# Patient Record
Sex: Male | Born: 1945
Health system: Southern US, Community
[De-identification: ages and names within clinical notes are randomized; demographics above are authoritative.]

## PROBLEM LIST (undated history)

## (undated) DIAGNOSIS — G4730 Sleep apnea, unspecified: Secondary | ICD-10-CM

## (undated) DIAGNOSIS — J449 Chronic obstructive pulmonary disease, unspecified: Secondary | ICD-10-CM

## (undated) DIAGNOSIS — Z860100 Personal history of colon polyps, unspecified: Secondary | ICD-10-CM

## (undated) DIAGNOSIS — I10 Essential (primary) hypertension: Secondary | ICD-10-CM

## (undated) DIAGNOSIS — M1990 Unspecified osteoarthritis, unspecified site: Secondary | ICD-10-CM

## (undated) DIAGNOSIS — K4090 Unilateral inguinal hernia, without obstruction or gangrene, not specified as recurrent: Secondary | ICD-10-CM

## (undated) HISTORY — PX: BACK SURGERY: SHX140

## (undated) HISTORY — DX: Unilateral inguinal hernia, without obstruction or gangrene, not specified as recurrent: K40.90

## (undated) HISTORY — PX: SHOULDER SURGERY: SHX246

## (undated) HISTORY — PX: OTHER SURGICAL HISTORY: SHX169

---

## 2004-08-16 ENCOUNTER — Other Ambulatory Visit: Admission: RE | Admit: 2004-08-16 | Discharge: 2004-08-16 | Payer: Self-pay | Admitting: Family Medicine

## 2005-08-26 HISTORY — PX: OTHER SURGICAL HISTORY: SHX169

## 2005-12-31 ENCOUNTER — Ambulatory Visit (HOSPITAL_COMMUNITY): Admission: RE | Admit: 2005-12-31 | Discharge: 2005-12-31 | Payer: Self-pay | Admitting: Family Medicine

## 2006-07-09 ENCOUNTER — Encounter (INDEPENDENT_AMBULATORY_CARE_PROVIDER_SITE_OTHER): Payer: Self-pay | Admitting: *Deleted

## 2006-07-09 ENCOUNTER — Ambulatory Visit (HOSPITAL_COMMUNITY): Admission: RE | Admit: 2006-07-09 | Discharge: 2006-07-09 | Payer: Self-pay | Admitting: Internal Medicine

## 2006-07-09 ENCOUNTER — Ambulatory Visit: Payer: Self-pay | Admitting: Internal Medicine

## 2007-01-12 ENCOUNTER — Ambulatory Visit (HOSPITAL_COMMUNITY): Admission: RE | Admit: 2007-01-12 | Discharge: 2007-01-12 | Payer: Self-pay | Admitting: Family Medicine

## 2008-02-18 ENCOUNTER — Ambulatory Visit (HOSPITAL_COMMUNITY): Admission: RE | Admit: 2008-02-18 | Discharge: 2008-02-18 | Payer: Self-pay | Admitting: Unknown Physician Specialty

## 2009-03-22 ENCOUNTER — Ambulatory Visit (HOSPITAL_COMMUNITY): Admission: RE | Admit: 2009-03-22 | Discharge: 2009-03-22 | Payer: Self-pay | Admitting: Family Medicine

## 2010-04-06 ENCOUNTER — Ambulatory Visit (HOSPITAL_COMMUNITY): Admission: RE | Admit: 2010-04-06 | Discharge: 2010-04-06 | Payer: Self-pay | Admitting: Internal Medicine

## 2011-01-11 NOTE — Op Note (Signed)
Zachary Gentry, Zachary Gentry                ACCOUNT NO.:  [id number]   MEDICAL RECORD NO.:  [id number]          PATIENT TYPE:  AMB   LOCATION:  DAY                           FACILITY:  [facility]   PHYSICIAN:  Lionel December, M.D.    DATE OF BIRTH:  November 20, 1945   DATE OF PROCEDURE:  07/09/2006  DATE OF DISCHARGE:                                 OPERATIVE REPORT   PROCEDURE PERFORMED:  Colonoscopy.   INDICATIONS FOR PROCEDURE:  Quitman Livings is 65 year old Caucasian male with  history of colonic polyps whose last exam was via [place] in [place], [place] who is  for surveillance exam.  Family history is negative for  colorectal carcinoma.   MEDS FOR CONSCIOUS SEDATION:  Demerol 50 mg IV, Versed 7 mg IV.   DESCRIPTION OF PROCEDURE:  Procedure performed in endoscopy suite.  The  patient's vital signs and oxygen saturation were monitored during the  procedure and remained stable.  The patient was placed in left lateral  recumbent position and rectal examination performed.  No abnormality noted  on external or digital exam.  Olympus video scope was placed in the rectum  and advanced under vision into sigmoid colon and beyond.  Preparation was  excellent.  Scope was passed to cecum which was identified by appendiceal  orifice and ileocecal valve.  There was small polyp just above ileocecal  valve which was ablated by cold biopsy.  Mucosa rest of the colon was  normal.  Rectal mucosa similarly was normal.  Scope was retroflexed to  examine the anorectal junction and small hemorrhoids were noted below the  dentate line.  Endoscope was straightened and withdrawn.  The patient  tolerated the procedure well.   FINAL DIAGNOSIS:  Small polyp ablated by cold biopsy from ascending colon.  Small internal hemorrhoids.   RECOMMENDATIONS:  1. He will resume his usual meds and diet.  2. Continue yearly Hemoccults.  3. I will be contacting patient results of biopsy. He will return for      follow-up exam in five  years, if that exam is negative then could      stretch the interval to seven years or longer.      Lionel December, M.D.  Electronically Signed    NR/MEDQ  D:  07/09/2006  T:  07/09/2006  Job:  [id number]   cc:   Patrica Duel, M.D.  Fax: [phone]

## 2011-04-18 ENCOUNTER — Other Ambulatory Visit (HOSPITAL_COMMUNITY): Payer: Self-pay | Admitting: Internal Medicine

## 2011-04-18 ENCOUNTER — Ambulatory Visit (HOSPITAL_COMMUNITY)
Admission: RE | Admit: 2011-04-18 | Discharge: 2011-04-18 | Disposition: A | Discharge status: Home / Self Care | Payer: BC Managed Care – PPO | Source: Ambulatory Visit | Attending: Internal Medicine | Admitting: Internal Medicine

## 2011-04-18 DIAGNOSIS — J449 Chronic obstructive pulmonary disease, unspecified: Secondary | ICD-10-CM

## 2011-04-18 DIAGNOSIS — R059 Cough, unspecified: Secondary | ICD-10-CM | POA: Insufficient documentation

## 2011-04-18 DIAGNOSIS — Z0000 Encounter for general adult medical examination without abnormal findings: Secondary | ICD-10-CM

## 2011-04-18 DIAGNOSIS — J4489 Other specified chronic obstructive pulmonary disease: Secondary | ICD-10-CM | POA: Insufficient documentation

## 2011-04-18 DIAGNOSIS — R05 Cough: Secondary | ICD-10-CM | POA: Insufficient documentation

## 2011-07-26 ENCOUNTER — Encounter (INDEPENDENT_AMBULATORY_CARE_PROVIDER_SITE_OTHER): Payer: Self-pay | Admitting: *Deleted

## 2011-08-12 ENCOUNTER — Telehealth (INDEPENDENT_AMBULATORY_CARE_PROVIDER_SITE_OTHER): Payer: Self-pay | Admitting: *Deleted

## 2011-08-12 NOTE — Telephone Encounter (Signed)
LM stating it was time for tcs. The return phone number is [phone].

## 2011-08-12 NOTE — Telephone Encounter (Signed)
Spoke to patient, made him aware TCS will not be until Feb or March and I would mail him a letter in January to get this scheduled

## 2011-08-29 ENCOUNTER — Encounter (INDEPENDENT_AMBULATORY_CARE_PROVIDER_SITE_OTHER): Payer: Self-pay | Admitting: *Deleted

## 2011-08-29 ENCOUNTER — Other Ambulatory Visit (INDEPENDENT_AMBULATORY_CARE_PROVIDER_SITE_OTHER): Payer: Self-pay | Admitting: *Deleted

## 2011-08-29 DIAGNOSIS — Z86010 Personal history of colonic polyps: Secondary | ICD-10-CM

## 2011-09-18 ENCOUNTER — Telehealth (INDEPENDENT_AMBULATORY_CARE_PROVIDER_SITE_OTHER): Payer: Self-pay | Admitting: *Deleted

## 2011-09-18 NOTE — Telephone Encounter (Signed)
Requesting MD/PCP:  [facility]     Name & DOB:  Zachary Gentry  May 27, 2046     Procedure: TCS  Reason/Indication:  HX POLYPS  Has patient had this procedure before?  YES  If so, when, by whom and where?  2007  Is there a family history of colon cancer?  NO  Who?  What age when diagnosed?    Is patient diabetic?   NO      Does patient have prosthetic heart valve?  NO  Do you have a pacemaker?  NO  Has patient had joint replacement within last 12 months?  NO  Is patient on Coumadin, Plavix and/or Aspirin? NO  Medications: LOSARTAN 50 MG DAILY  Allergies: NKDA  Pharmacy:   Medication Adjustment: NONE  Procedure date & time: 10/02/11 @ 930

## 2011-09-20 ENCOUNTER — Encounter (HOSPITAL_COMMUNITY): Payer: Self-pay | Admitting: Pharmacy Technician

## 2011-09-20 NOTE — Telephone Encounter (Signed)
agree

## 2011-10-01 MED ORDER — SODIUM CHLORIDE 0.45 % IV SOLN
Freq: Once | INTRAVENOUS | Status: AC
  Administered 2011-10-02: 1000 mL via INTRAVENOUS

## 2011-10-02 ENCOUNTER — Ambulatory Visit (HOSPITAL_COMMUNITY)
Admission: RE | Admit: 2011-10-02 | Discharge: 2011-10-02 | Disposition: A | Discharge status: Home / Self Care | Payer: BC Managed Care – PPO | Source: Ambulatory Visit | Attending: Internal Medicine | Admitting: Internal Medicine

## 2011-10-02 ENCOUNTER — Encounter (HOSPITAL_COMMUNITY)
Admission: RE | Disposition: A | Discharge status: Home / Self Care | Payer: Self-pay | Source: Ambulatory Visit | Attending: Internal Medicine

## 2011-10-02 ENCOUNTER — Encounter (HOSPITAL_COMMUNITY): Payer: Self-pay | Admitting: *Deleted

## 2011-10-02 DIAGNOSIS — I10 Essential (primary) hypertension: Secondary | ICD-10-CM | POA: Insufficient documentation

## 2011-10-02 DIAGNOSIS — K644 Residual hemorrhoidal skin tags: Secondary | ICD-10-CM | POA: Insufficient documentation

## 2011-10-02 DIAGNOSIS — Z79899 Other long term (current) drug therapy: Secondary | ICD-10-CM | POA: Insufficient documentation

## 2011-10-02 DIAGNOSIS — Z860100 Personal history of colon polyps, unspecified: Secondary | ICD-10-CM | POA: Insufficient documentation

## 2011-10-02 DIAGNOSIS — J4489 Other specified chronic obstructive pulmonary disease: Secondary | ICD-10-CM | POA: Insufficient documentation

## 2011-10-02 DIAGNOSIS — Z09 Encounter for follow-up examination after completed treatment for conditions other than malignant neoplasm: Secondary | ICD-10-CM | POA: Insufficient documentation

## 2011-10-02 DIAGNOSIS — J449 Chronic obstructive pulmonary disease, unspecified: Secondary | ICD-10-CM | POA: Insufficient documentation

## 2011-10-02 DIAGNOSIS — Z1211 Encounter for screening for malignant neoplasm of colon: Secondary | ICD-10-CM

## 2011-10-02 HISTORY — DX: Essential (primary) hypertension: I10

## 2011-10-02 HISTORY — DX: Personal history of colonic polyps: Z86.010

## 2011-10-02 HISTORY — DX: Chronic obstructive pulmonary disease, unspecified: J44.9

## 2011-10-02 HISTORY — DX: Unspecified osteoarthritis, unspecified site: M19.90

## 2011-10-02 HISTORY — DX: Personal history of colon polyps, unspecified: Z86.0100

## 2011-10-02 HISTORY — PX: COLONOSCOPY: SHX5424

## 2011-10-02 SURGERY — COLONOSCOPY
Anesthesia: Moderate Sedation

## 2011-10-02 MED ORDER — MIDAZOLAM HCL 5 MG/5ML IJ SOLN
INTRAMUSCULAR | Status: DC | PRN
  Administered 2011-10-02 (×4): 2 mg via INTRAVENOUS

## 2011-10-02 MED ORDER — STERILE WATER FOR IRRIGATION IR SOLN
Status: DC | PRN
  Administered 2011-10-02: 09:00:00

## 2011-10-02 MED ORDER — MEPERIDINE HCL 50 MG/ML IJ SOLN
INTRAMUSCULAR | Status: AC
  Filled 2011-10-02: qty 1

## 2011-10-02 MED ORDER — MEPERIDINE HCL 50 MG/ML IJ SOLN
INTRAMUSCULAR | Status: DC | PRN
  Administered 2011-10-02 (×2): 25 mg via INTRAVENOUS

## 2011-10-02 MED ORDER — MIDAZOLAM HCL 5 MG/5ML IJ SOLN
INTRAMUSCULAR | Status: AC
  Filled 2011-10-02: qty 10

## 2011-10-02 NOTE — H&P (Signed)
Zachary Gentry is an 66 y.o. male.   Chief Complaint: Patient is in for colonoscopy. HPI: Patient is a 66 year old Caucasian with history of colonic polyps. He had 3 adenomas removed on his first exam at [facility] in [place]. His Lasix was at this facility in November 2007 and another tubular adenoma removed. He is undergoing surveillance colonoscopy. He denies change in his bowel habits, rectal bleeding or abdominal pain. Family history is negative for colorectal carcinoma.  Past Medical History  Diagnosis Date  . Hypertension   . COPD (chronic obstructive pulmonary disease)   . Hemorrhoids   . Arthritis   . History of colon polyps     Past Surgical History  Procedure Date  . Back surgery '79  . Right knee surgery 2007  . Left knee surgery '96    History reviewed. No pertinent family history. Social History:  reports that he has been passively smoking Cigarettes.  He has a 6.25 pack-year smoking history. He does not have any smokeless tobacco history on file. He reports that he drinks alcohol. He reports that he does not use illicit drugs.  Allergies: No Known Allergies  Medications Prior to Admission  Medication Dose Route Frequency Provider Last Rate Last Dose  . 0.45 % sodium chloride infusion   Intravenous Once Malissa Hippo, MD 20 mL/hr at 10/02/11 0903 1,000 mL at 10/02/11 0903  . meperidine (DEMEROL) 50 MG/ML injection           . midazolam (VERSED) 5 MG/5ML injection            Medications Prior to Admission  Medication Sig Dispense Refill  . b complex vitamins tablet Take 1 tablet by mouth daily.      . diazepam (VALIUM) 10 MG tablet Take 10 mg by mouth as needed. For anxiety      . ibuprofen (ADVIL,MOTRIN) 200 MG tablet Take 600 mg by mouth every 6 (six) hours as needed. For pain      . losartan (COZAAR) 50 MG tablet Take 50 mg by mouth daily.      . milk thistle 175 MG tablet Take 175 mg by mouth daily.        No results found for this or any previous visit  (from the past 48 hour(s)). No results found.  Review of Systems  Constitutional: Negative for weight loss.  Gastrointestinal: Negative for abdominal pain, diarrhea, constipation, blood in stool and melena.    Blood pressure 131/77, temperature 97 F (36.1 C), temperature source Oral, resp. rate 13, height 5' 10.5" (1.791 m), weight 192 lb (87.091 kg), SpO2 100.00%. Physical Exam  Constitutional: He appears well-developed and well-nourished.  HENT:  Mouth/Throat: Oropharynx is clear and moist.  Eyes: Conjunctivae are normal. No scleral icterus.  Neck: No thyromegaly present.  Cardiovascular: Normal rate, regular rhythm and normal heart sounds.   No murmur heard. Respiratory: Effort normal and breath sounds normal.  GI: Soft. He exhibits no distension and no mass. There is no tenderness.  Musculoskeletal: He exhibits no edema.  Lymphadenopathy:    He has no cervical adenopathy.  Neurological: He is alert.  Skin: Skin is warm and dry.     Assessment/Plan History of colonic polyps. Surveillance colonoscopy.  Zachary Gentry 10/02/2011, 9:10 AM

## 2011-10-02 NOTE — Op Note (Signed)
COLONOSCOPY PROCEDURE REPORT  PATIENT:  Zachary Gentry  MR#:  [id number] Birthdate:  01-08-1946, 66 y.o., male Endoscopist:  Dr. Malissa Hippo, MD Referred By:  Dr. Madelin Rear. Sherwood Gambler, MD Procedure Date: 10/02/2011  Procedure:   Colonoscopy  Indications:  Patient is 66 year old Caucasian male with history of colonic adenomas. He is undergoing surveillance colonoscopy. His Lasix and was in November 2007.  Informed Consent:  Procedure and risks were reviewed with the patient and informed consent was obtained.  Medications:  Demerol 50 mg IV Versed 8 mg IV  Description of procedure:  After a digital rectal exam was performed, that colonoscope was advanced from the anus through the rectum and colon to the area of the cecum, ileocecal valve and appendiceal orifice. The cecum was deeply intubated. These structures were well-seen and photographed for the record. From the level of the cecum and ileocecal valve, the scope was slowly and cautiously withdrawn. The mucosal surfaces were carefully surveyed utilizing scope tip to flexion to facilitate fold flattening as needed. The scope was pulled down into the rectum where a thorough exam including retroflexion was performed.  Findings:   Prep excellent. Normal mucosa throughout. Normal rectal mucosa.  Small hemorrhoids below the dentate line.  Therapeutic/Diagnostic Maneuvers Performed:  None  Complications:  None  Cecal Withdrawal Time:  11 minutes  Impression:  Examination performed to cecum. No evidence of recurrent polyps. Small external hemorrhoids.  Recommendations:  Standard instructions given. Next colonoscopy in 5 years.  Zachary Gentry  10/02/2011 9:39 AM  CC: Dr. Cassell Smiles., MD, MD & Dr. Bonnetta Barry ref. provider found

## 2011-10-08 ENCOUNTER — Encounter (HOSPITAL_COMMUNITY): Payer: Self-pay | Admitting: Internal Medicine

## 2012-02-19 ENCOUNTER — Other Ambulatory Visit (HOSPITAL_COMMUNITY): Payer: Self-pay | Admitting: Internal Medicine

## 2012-02-19 DIAGNOSIS — R1084 Generalized abdominal pain: Secondary | ICD-10-CM

## 2012-02-21 ENCOUNTER — Ambulatory Visit (HOSPITAL_COMMUNITY)
Admission: RE | Admit: 2012-02-21 | Discharge: 2012-02-21 | Disposition: A | Discharge status: Home / Self Care | Payer: BC Managed Care – PPO | Source: Ambulatory Visit | Attending: Internal Medicine | Admitting: Internal Medicine

## 2012-02-21 DIAGNOSIS — R1032 Left lower quadrant pain: Secondary | ICD-10-CM | POA: Insufficient documentation

## 2012-02-21 DIAGNOSIS — R1031 Right lower quadrant pain: Secondary | ICD-10-CM | POA: Insufficient documentation

## 2012-02-21 DIAGNOSIS — R1084 Generalized abdominal pain: Secondary | ICD-10-CM

## 2012-02-21 DIAGNOSIS — R933 Abnormal findings on diagnostic imaging of other parts of digestive tract: Secondary | ICD-10-CM | POA: Insufficient documentation

## 2012-02-21 DIAGNOSIS — K439 Ventral hernia without obstruction or gangrene: Secondary | ICD-10-CM | POA: Insufficient documentation

## 2012-02-21 DIAGNOSIS — K4090 Unilateral inguinal hernia, without obstruction or gangrene, not specified as recurrent: Secondary | ICD-10-CM | POA: Insufficient documentation

## 2012-02-21 DIAGNOSIS — N400 Enlarged prostate without lower urinary tract symptoms: Secondary | ICD-10-CM | POA: Insufficient documentation

## 2012-02-21 MED ORDER — IOHEXOL 300 MG/ML  SOLN
100.0000 mL | Freq: Once | INTRAMUSCULAR | Status: AC | PRN
  Administered 2012-02-21: 100 mL via INTRAVENOUS

## 2012-02-26 ENCOUNTER — Encounter (INDEPENDENT_AMBULATORY_CARE_PROVIDER_SITE_OTHER): Payer: Self-pay | Admitting: *Deleted

## 2012-03-24 ENCOUNTER — Encounter (INDEPENDENT_AMBULATORY_CARE_PROVIDER_SITE_OTHER): Payer: Self-pay | Admitting: Internal Medicine

## 2012-03-24 ENCOUNTER — Ambulatory Visit (INDEPENDENT_AMBULATORY_CARE_PROVIDER_SITE_OTHER): Payer: BC Managed Care – PPO | Admitting: Internal Medicine

## 2012-03-24 VITALS — BP 104/70 | HR 72 | Temp 96.7°F | Resp 20 | Ht 70.0 in | Wt 185.0 lb

## 2012-03-24 DIAGNOSIS — E291 Testicular hypofunction: Secondary | ICD-10-CM

## 2012-03-24 DIAGNOSIS — Z860100 Personal history of colon polyps, unspecified: Secondary | ICD-10-CM | POA: Insufficient documentation

## 2012-03-24 DIAGNOSIS — I10 Essential (primary) hypertension: Secondary | ICD-10-CM | POA: Insufficient documentation

## 2012-03-24 DIAGNOSIS — G8929 Other chronic pain: Secondary | ICD-10-CM | POA: Insufficient documentation

## 2012-03-24 DIAGNOSIS — K529 Noninfective gastroenteritis and colitis, unspecified: Secondary | ICD-10-CM

## 2012-03-24 DIAGNOSIS — K5289 Other specified noninfective gastroenteritis and colitis: Secondary | ICD-10-CM

## 2012-03-24 DIAGNOSIS — R7989 Other specified abnormal findings of blood chemistry: Secondary | ICD-10-CM

## 2012-03-24 LAB — CBC WITH DIFFERENTIAL/PLATELET
Basophils Absolute: 0.1 K/uL (ref 0.0–0.1)
Basophils Relative: 1 % (ref 0–1)
Eosinophils Absolute: 0.2 K/uL (ref 0.0–0.7)
Eosinophils Relative: 4 % (ref 0–5)
HCT: 44 % (ref 39.0–52.0)
Hemoglobin: 15.1 g/dL (ref 13.0–17.0)
Lymphocytes Relative: 27 % (ref 12–46)
Lymphs Abs: 1.2 K/uL (ref 0.7–4.0)
MCH: 33.1 pg (ref 26.0–34.0)
MCHC: 34.3 g/dL (ref 30.0–36.0)
MCV: 96.5 fL (ref 78.0–100.0)
Monocytes Absolute: 0.3 K/uL (ref 0.1–1.0)
Monocytes Relative: 7 % (ref 3–12)
Neutro Abs: 2.7 K/uL (ref 1.7–7.7)
Neutrophils Relative %: 61 % (ref 43–77)
Platelets: 246 K/uL (ref 150–400)
RBC: 4.56 MIL/uL (ref 4.22–5.81)
RDW: 13.7 % (ref 11.5–15.5)
WBC: 4.5 K/uL (ref 4.0–10.5)

## 2012-03-24 LAB — C-REACTIVE PROTEIN: CRP: <0.5 mg/dL

## 2012-03-24 MED ORDER — CIPROFLOXACIN HCL 500 MG PO TABS: 500.0000 mg | ORAL_TABLET | Freq: Two times a day (BID) | ORAL | Status: AC

## 2012-03-24 MED ORDER — METRONIDAZOLE 500 MG PO TABS: 500.0000 mg | ORAL_TABLET | Freq: Two times a day (BID) | ORAL | Status: AC

## 2012-03-24 NOTE — Progress Notes (Signed)
Presenting complaint;  Abnormal CT revealing changes of ileitis. Subjective:  Zachary Gentry is 66 year old Caucasian male who is referred through courtesy of Dr. Sherwood Gambler for GI evaluation. About 7 or 8 weeks ago he experienced sudden onset of sharp fleeting penile pain. He did not experience hematuria dysuria abdominal or flank pain. He had 3 episodes the following day. He drank 2 cans of beer. He also noted transient lower abdominal pain discomfort. He saw Dr. Sherlene Shams who obtained abdominopelvic CT on 02/21/2012 which revealed changes of terminal ileitis. Patient was told he may have Crohn disease and hence he is here for further evaluation. He hasn't had any more episodes of abdominal pain. He denies diarrhea or rectal bleeding nausea vomiting fever or chills. No history of aphthous oral ulcers or melena. He tells me that he was in [place] about 40 days ago. He felt water was not right and did not drink at all he did rinse cup or glass with his water. Patient complains of back pain and takes 3 Advil every morning with meal and he may take another dose in the evening. He has history of colonic adenomas and he just had colonoscopy on 10/02/2011. He had normal exam other than small external hemorrhoids. Terminal ileum was not examined. Family history is negative for inflammatory bowel disease.   Current Medications: Current Outpatient Prescriptions  Medication Sig Dispense Refill  . b complex vitamins tablet Take 1 tablet by mouth daily.      . diazepam (VALIUM) 10 MG tablet Take 5 mg by mouth as needed. For anxiety      . ibuprofen (ADVIL,MOTRIN) 200 MG tablet Take 600 mg by mouth every 6 (six) hours as needed. For pain      . losartan (COZAAR) 50 MG tablet Take 50 mg by mouth daily.      . milk thistle 175 MG tablet Take 175 mg by mouth daily.      . TESTOSTERONE IM Inject 200 mg into the muscle. Patient rec's this injection every 30 days      . zolpidem (AMBIEN) 10 MG tablet 5 mg as needed.          past medical history; History of colonic adenomas. Recent colonoscopy as above. Hypertension. Generalized osteoarthrosis. Chronic low back pain. Low T. Syndrome. Insomnia   Objective: Blood pressure 104/70, pulse 72, temperature 96.7 F (35.9 C), temperature source Oral, resp. rate 20, height 5\' 10"  (1.778 m), weight 185 lb (83.915 kg). Patient is alert and in no acute distress. Conjunctiva is pink. Sclera is nonicteric Oropharyngeal mucosa is normal. No neck masses or thyromegaly noted. Cardiac exam with regular rhythm normal S1 and S2. No murmur or gallop noted. Lungs are clear to auscultation. Abdomen is flat. Bowel sounds are normal. Palpation reveals soft abdomen without tenderness organomegaly or masses. No LE edema or clubbing noted.  Labs/studies Results: CT films from 02/21/2012 reviewed with patient. He has thickening terminal ileum. He also has small umbilicus and right inguinal hernia.  Assessment:  Patient has localized thickening to terminal ileum. He has no GI symptoms at the present time. Based on this history and possible exposure to contaminated water he may have a bacterial ileitis rather than Crohn's. NSAID enteropathy would be another possible diagnosis.   Plan:  Patient advised to cut back on use of Advil to minimum. He can take Tylenol up to 2 g per day. CBC with differential, sedimentation rate and CRP. Hemoccult x1. Cipro 500 mg by mouth twice a day for 10  days. Metronidazole 500 mg by mouth twice a day for 10 days. Office visit in one month.

## 2012-03-24 NOTE — Patient Instructions (Addendum)
Keep symptoms diary until office visit. Take Advil no more than 1 or 2 pills at a time twice daily as needed. Notify if he have any side effects with antibiotics. Physician will contact you with results of blood work.

## 2012-04-21 ENCOUNTER — Ambulatory Visit (INDEPENDENT_AMBULATORY_CARE_PROVIDER_SITE_OTHER): Payer: BC Managed Care – PPO | Admitting: Internal Medicine

## 2012-05-04 ENCOUNTER — Ambulatory Visit (INDEPENDENT_AMBULATORY_CARE_PROVIDER_SITE_OTHER): Payer: BC Managed Care – PPO | Admitting: Internal Medicine

## 2012-05-07 ENCOUNTER — Telehealth (INDEPENDENT_AMBULATORY_CARE_PROVIDER_SITE_OTHER): Payer: Self-pay | Admitting: *Deleted

## 2012-05-07 NOTE — Telephone Encounter (Signed)
Zachary Gentry called and has ask that his bill for date of service 10/02/11 be resubmitted to include the word wellness. He states that he has talked with the attorney at [facility] and was advised to have [facility] do this. I ask the patient if Dr.Rehman found anything on this Colonoscopy and he stated not on this one. Zachary Gentry has a history of Colonic Polyps. I advised patient that we did not do the billing here but I would make my manager aware. He provided the following information .Account number is [id number] - Zachary Gentry - Date of service 10/02/2011. Zachary Gentry was also advised that it would not be today that he would hear back from [place]. His telephone number was [phone]

## 2012-05-18 NOTE — Telephone Encounter (Signed)
This has been handled and awaiting to hear back from [place].

## 2012-05-21 ENCOUNTER — Telehealth (INDEPENDENT_AMBULATORY_CARE_PROVIDER_SITE_OTHER): Payer: Self-pay | Admitting: *Deleted

## 2012-05-21 NOTE — Telephone Encounter (Signed)
ADDRESSED

## 2012-05-25 ENCOUNTER — Ambulatory Visit (INDEPENDENT_AMBULATORY_CARE_PROVIDER_SITE_OTHER): Payer: BC Managed Care – PPO | Admitting: Internal Medicine

## 2012-05-25 ENCOUNTER — Encounter (INDEPENDENT_AMBULATORY_CARE_PROVIDER_SITE_OTHER): Payer: Self-pay | Admitting: Internal Medicine

## 2012-05-25 VITALS — BP 112/72 | HR 74 | Temp 97.7°F | Resp 20 | Ht 70.0 in | Wt 186.0 lb

## 2012-05-25 DIAGNOSIS — K5289 Other specified noninfective gastroenteritis and colitis: Secondary | ICD-10-CM

## 2012-05-25 DIAGNOSIS — K529 Noninfective gastroenteritis and colitis, unspecified: Secondary | ICD-10-CM

## 2012-05-25 NOTE — Progress Notes (Signed)
Presenting complaint;  Followup for ileitis.  Subjective:  Patient is 66 year old Caucasian male who was last seen on 03/24/2012 for evaluation of ileitis. He presented to Dr. Carlena Sax with pain involving external genitalia also had intermittent lower abdominal pain. CT reviewed thickening to segment of terminal ileum. Patient has been on NSAID therapy as well. Was felt he possibly had an infection and he was therefore treated with Cipro and Flagyl. Since finishing antibiotic therapy he has few episodes of abdominal cramps or gas pains and one episode of diarrhea. His bowels otherwise have been regular. She denies nausea vomiting melena or rectal bleeding. He is taking 400 mg of ibuprofen daily. He is seeking physical therapy to his right knee and he also has brace that he uses when he is ambulatory.  Current Medications: Current Outpatient Prescriptions  Medication Sig Dispense Refill  . b complex vitamins tablet Take 1 tablet by mouth daily.      . diazepam (VALIUM) 10 MG tablet Take 5 mg by mouth as needed. For anxiety      . FLUOCINOLONE ACETONIDE BODY 0.01 % external oil Apply topically as needed.       . fluticasone (FLONASE) 50 MCG/ACT nasal spray Place 1 spray into the nose as needed.       Marland Kitchen ibuprofen (ADVIL,MOTRIN) 200 MG tablet Take 400 mg by mouth every 6 (six) hours as needed. For pain      . losartan (COZAAR) 50 MG tablet Take 50 mg by mouth daily.      . milk thistle 175 MG tablet Take 175 mg by mouth daily.      . TESTOSTERONE IM Inject 200 mg into the muscle. Patient rec's this injection every 30 days      . zolpidem (AMBIEN) 10 MG tablet 5 mg as needed.         Objective: Blood pressure 112/72, pulse 74, temperature 97.7 F (36.5 C), temperature source Oral, resp. rate 20, height 5\' 10"  (1.778 m), weight 186 lb (84.369 kg). Patient is alert and in no acute distress. Conjunctiva is pink. Sclera is nonicteric Oropharyngeal mucosa is normal. No neck masses or thyromegaly  noted. Abdomen is symmetrical. Bowel sounds are normal. On palpation soft abdomen without tenderness organomegaly or masses.  No LE edema or clubbing noted.  Labs/studies Results: CT films from 02/19/2012 reviewed again. He has a very low-lying terminal ileum which possibly would explain location of pain.   Assessment:  Ileitis.  Patient was empirically treated with Cipro and metronidazole 8 weeks ago. He has had sporadic abdominal cramps. Since he needs to be on NSAID therapy he needs followup study to make sure inflammatory process has resolved and we are not dealing with Crohn's disease. Will repeat CT in compare with prior study. I do not believe there is any indication or need for colonoscopy at this time.   Plan:  Abdominopelvic CT with contrast. For now patient will take no more than 400 mg of ibuprofen daily.

## 2012-05-25 NOTE — Patient Instructions (Signed)
Physician will contact you with results of CT when completed 

## 2012-05-27 ENCOUNTER — Telehealth (INDEPENDENT_AMBULATORY_CARE_PROVIDER_SITE_OTHER): Payer: Self-pay | Admitting: *Deleted

## 2012-05-27 NOTE — Telephone Encounter (Signed)
Noted  

## 2012-05-27 NOTE — Telephone Encounter (Signed)
Zachary Gentry called and left a message that he is to have a CT this week. He request that he be called with results before the weekend.

## 2012-05-28 ENCOUNTER — Other Ambulatory Visit (HOSPITAL_COMMUNITY): Payer: BC Managed Care – PPO

## 2012-05-28 ENCOUNTER — Ambulatory Visit (HOSPITAL_COMMUNITY)
Admission: RE | Admit: 2012-05-28 | Discharge: 2012-05-28 | Disposition: A | Discharge status: Home / Self Care | Payer: BC Managed Care – PPO | Source: Ambulatory Visit | Attending: Internal Medicine | Admitting: Internal Medicine

## 2012-05-28 DIAGNOSIS — Q619 Cystic kidney disease, unspecified: Secondary | ICD-10-CM | POA: Insufficient documentation

## 2012-05-28 DIAGNOSIS — K4090 Unilateral inguinal hernia, without obstruction or gangrene, not specified as recurrent: Secondary | ICD-10-CM | POA: Insufficient documentation

## 2012-05-28 DIAGNOSIS — K529 Noninfective gastroenteritis and colitis, unspecified: Secondary | ICD-10-CM

## 2012-05-28 DIAGNOSIS — K5289 Other specified noninfective gastroenteritis and colitis: Secondary | ICD-10-CM | POA: Insufficient documentation

## 2012-05-28 MED ORDER — IOHEXOL 300 MG/ML  SOLN
100.0000 mL | Freq: Once | INTRAMUSCULAR | Status: AC | PRN
  Administered 2012-05-28: 100 mL via INTRAVENOUS

## 2012-06-11 ENCOUNTER — Encounter (INDEPENDENT_AMBULATORY_CARE_PROVIDER_SITE_OTHER): Payer: Self-pay

## 2013-04-08 ENCOUNTER — Ambulatory Visit (HOSPITAL_COMMUNITY)
Admission: RE | Admit: 2013-04-08 | Discharge: 2013-04-08 | Disposition: A | Discharge status: Home / Self Care | Payer: Medicare Other | Source: Ambulatory Visit | Attending: Family Medicine | Admitting: Family Medicine

## 2013-04-08 ENCOUNTER — Other Ambulatory Visit (HOSPITAL_COMMUNITY): Payer: Self-pay | Admitting: Family Medicine

## 2013-04-08 DIAGNOSIS — J4489 Other specified chronic obstructive pulmonary disease: Secondary | ICD-10-CM | POA: Insufficient documentation

## 2013-04-08 DIAGNOSIS — M47814 Spondylosis without myelopathy or radiculopathy, thoracic region: Secondary | ICD-10-CM | POA: Diagnosis not present

## 2013-04-08 DIAGNOSIS — J449 Chronic obstructive pulmonary disease, unspecified: Secondary | ICD-10-CM | POA: Diagnosis not present

## 2013-04-08 DIAGNOSIS — R002 Palpitations: Secondary | ICD-10-CM | POA: Diagnosis not present

## 2013-04-08 DIAGNOSIS — Z6825 Body mass index (BMI) 25.0-25.9, adult: Secondary | ICD-10-CM | POA: Diagnosis not present

## 2013-04-21 ENCOUNTER — Ambulatory Visit (INDEPENDENT_AMBULATORY_CARE_PROVIDER_SITE_OTHER): Payer: Medicare Other | Admitting: Cardiovascular Disease

## 2013-04-21 ENCOUNTER — Encounter: Payer: Self-pay | Admitting: Cardiovascular Disease

## 2013-04-21 VITALS — BP 140/92 | HR 69 | Ht 70.0 in | Wt 178.2 lb

## 2013-04-21 DIAGNOSIS — I10 Essential (primary) hypertension: Secondary | ICD-10-CM

## 2013-04-21 DIAGNOSIS — R0789 Other chest pain: Secondary | ICD-10-CM | POA: Diagnosis not present

## 2013-04-21 DIAGNOSIS — R002 Palpitations: Secondary | ICD-10-CM | POA: Diagnosis not present

## 2013-04-21 MED ORDER — METOPROLOL TARTRATE 25 MG PO TABS: ORAL_TABLET | ORAL | Status: DC

## 2013-04-21 NOTE — Progress Notes (Signed)
Patient ID: Zachary Gentry, male   DOB: August 24, 1946, 67 y.o.   MRN: [id number]     PATIENT PROFILE: Mr. Zachary Gentry is a 67 year old gentleman who presents for cardiology evaluation for the courtesy of Dr. Phillips Odor for evaluation of an irregular heart rhythm.   HPI: STIR Frix denies any known history of prior cardiac arrhythmia. He does have a history of hypertension for which she has taken losartan. He does exercise fairly regularly he states that on August 13 he felt that his heart was out of rhythm. He saw Dr. Phillips Odor on August 14 apparently his EKG showed sinus rhythm. The patient does ride a bike and does aerobic exercise. He denies any syncope. He denies chest pressure.  He has a history of low testosterone. He presents now for cardiological evaluation.  Past Medical History  Diagnosis Date  . Hypertension   . COPD (chronic obstructive pulmonary disease)   . Hemorrhoids   . Arthritis   . History of colon polyps     Past Surgical History  Procedure Laterality Date  . Back surgery  '79  . Right knee surgery  2007  . Left knee surgery  '96  . Colonoscopy  10/02/2011    Procedure: COLONOSCOPY;  Surgeon: Malissa Hippo, MD;  Location: [facility];  Service: Endoscopy;  Laterality: N/A;  930    No Known Allergies  Current Outpatient Prescriptions  Medication Sig Dispense Refill  . b complex vitamins tablet Take 1 tablet by mouth daily.      . diazepam (VALIUM) 10 MG tablet Take 5 mg by mouth as needed. For anxiety      . FLUOCINOLONE ACETONIDE BODY 0.01 % external oil Apply topically as needed.       . fluticasone (FLONASE) 50 MCG/ACT nasal spray Place 1 spray into the nose as needed.       Marland Kitchen ibuprofen (ADVIL,MOTRIN) 200 MG tablet Take 400 mg by mouth every 6 (six) hours as needed. For pain      . losartan (COZAAR) 50 MG tablet Take 50 mg by mouth daily.      . milk thistle 175 MG tablet Take 175 mg by mouth daily.      . TESTOSTERONE IM Inject 200 mg into the muscle. Patient  rec's this injection every 30 days      . zolpidem (AMBIEN) 10 MG tablet 5 mg as needed.       . metoprolol tartrate (LOPRESSOR) 25 MG tablet Take 1 tablet as needed for palpitations  30 tablet  3   No current facility-administered medications for this visit.    Social he is divorced for 11 years. 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 There is remote history of smoking cigars but he quit 3 years ago. He does drink alcohol.  Family History  Problem Relation Age of Onset  . Liver cancer Mother   . Healthy Sister   . Healthy Brother     ROS is negative for fever chills or night sweats. He admits to having an episode of chest pain approximately 14 years ago. He denies any exercise-induced chest pain. He states his heart rhythm was irregular for some time he is unaware of any previous diagnosis of atrial fibrillation. He denies presyncope. He denies wheezing. He denies abdominal pain. There is no change in bowel or bladder habits. He denies excessive caffeine use. He does drink alcohol. He does experience pain in his right knee intermittently. He denies paresthesias.  He denies edema. Other system review is negative.  PE BP 140/92  Pulse 69  Ht 5\' 10"  (1.778 m)  Wt 178 lb 3.2 oz (80.831 kg)  BMI 25.57 kg/m2 Repeat blood pressure by me was 125/80 supine was 120/80 standing with a pulse of 65 without significant pulse increase General: Alert, oriented, no distress.  Skin: normal turgor, no rashes HEENT: Normocephalic, atraumatic. Pupils round and reactive; sclera anicteric; Fundi without hemorrhages or exudates Nose without nasal septal hypertrophy Mouth/Parynx benign; Mallinpatti scale Neck: No JVD, no carotid briuts Lungs: clear to ausculatation and percussion; no wheezing or rales Heart: RRR, s1 s2 normal faint 1/6 systolic murmur. No S3 gallop. No rub Abdomen: soft, nontender; no hepatosplenomehaly, BS+; abdominal aorta nontender and not dilated by  palpation. Pulses 2+ Extremities: no clubbinbg cyanosis or edema, Homan's sign negative  Neurologic: grossly nonfocal    ECG: Sinus rhythm with sinus arrhythmia and occasional premature atrial complex. No evidence for preexcitation. Normal QT interval at 426 ms  LABS:  BMET    Component Value Date/Time   NA 138 04/27/2013 1625   K 4.2 04/27/2013 1625   CL 102 04/27/2013 1625   CO2 27 04/27/2013 1625   GLUCOSE 90 04/27/2013 1625   BUN 13 04/27/2013 1625   CREATININE 0.86 04/27/2013 1625   CALCIUM 9.3 04/27/2013 1625     Hepatic Function Panel     Component Value Date/Time   PROT 6.5 04/27/2013 1625   ALBUMIN 4.2 04/27/2013 1625   AST 14 04/27/2013 1625   ALT 14 04/27/2013 1625   ALKPHOS 32* 04/27/2013 1625   BILITOT 0.9 04/27/2013 1625     CBC    Component Value Date/Time   WBC 6.1 04/27/2013 1625   RBC 4.31 04/27/2013 1625   HGB 14.5 04/27/2013 1625   HCT 41.4 04/27/2013 1625   PLT 232 04/27/2013 1625   MCV 96.1 04/27/2013 1625   MCH 33.6 04/27/2013 1625   MCHC 35.0 04/27/2013 1625   RDW 13.5 04/27/2013 1625   LYMPHSABS 1.2 03/24/2012 1005   MONOABS 0.3 03/24/2012 1005   EOSABS 0.2 03/24/2012 1005   BASOSABS 0.1 03/24/2012 1005     BNP No results found for this basename: probnp    Lipid Panel  No results found for this basename: chol, trig, hdl, cholhdl, vldl, ldlcalc     RADIOLOGY: Dg Chest 2 View  04/08/2013   *RADIOLOGY REPORT*  Clinical Data: Palpitations, COPD  CHEST - 2 VIEW  Comparison: 04/18/2011  Findings: Cardiomediastinal silhouette is stable.  Mild hyperinflation again noted.  No acute infiltrate or pleural effusion.  No pulmonary edema.  Stable mild degenerative changes thoracic spine.  IMPRESSION: No active disease.  Mild hyperinflation.   Original Report Authenticated By: Natasha Mead, M.D.     ASSESSMENT AND PLAN: Zachary Gentry is a 67 year old active gentleman who admits to recent development of an irregular heart rhythm and palpitations. His EKG demonstrates sinus arrhythmia with  PACs. I did review old laboratory from 2013. I am recommending that he undergo no complete set of new laboratory. I'm also scheduling him for a 2-D echo Doppler study I'm starting him on Lopressor 25 mg twice a day. I will see him in 4 weeks for followup evaluation.   Lennette Bihari, MD, [facility] 05/06/2013 8:01 PM

## 2013-04-21 NOTE — Patient Instructions (Addendum)
Your physician has requested that you have an echocardiogram. Echocardiography is a painless test that uses sound waves to create images of your heart. It provides your doctor with information about the size and shape of your heart and how well your heart's chambers and valves are working. This procedure takes approximately one hour. There are no restrictions for this procedure.  Your physician recommends that you return for lab work fasting. You will not need a appointment for this. The lab opens @ 8:00 A.M. And is located on the first floor of our building.    Your physician has recommended you make the following change in your medication: START LOPRESSOR 25 MG( this has already been sent to your pharmacy.) Your physician recommends that you schedule a follow-up appointment in: 4 WEEKS

## 2013-04-22 ENCOUNTER — Telehealth (HOSPITAL_COMMUNITY): Payer: Self-pay | Admitting: Cardiovascular Disease

## 2013-04-22 NOTE — Telephone Encounter (Signed)
Wanted to speak with Dr. Tresa Endo nurse because he needs ultrasound and blood work done at [facility] (Due to price). Their telephone number is [phone] and please ask for [facility]. Please call pt when finished at [phone].

## 2013-04-22 NOTE — Telephone Encounter (Signed)
Message forwarded to W. Waddell, CMA.  

## 2013-04-22 NOTE — Telephone Encounter (Signed)
Still waiting to hear from Dr Tresa Endo so they can schedule his ultrasound and blood work at [facility].

## 2013-04-22 NOTE — Telephone Encounter (Signed)
Pt found another facility to have the echo done that was ordered by Dr. Tresa Endo. Can you please send a script for the echo to [facility] [phone] (office #). Pt was unable to get the fax #.

## 2013-04-23 ENCOUNTER — Encounter: Payer: Self-pay | Admitting: *Deleted

## 2013-04-23 NOTE — Telephone Encounter (Signed)
This patient has called again to check on the status of this order.

## 2013-04-23 NOTE — Telephone Encounter (Signed)
Faxed order to [facility] for patient to get ECHO done there per his request.

## 2013-04-23 NOTE — Telephone Encounter (Signed)
Please be sure to talk to [facility] at [facility] and [facility]-[phone]-call before 5-He needs blood and ultrasound.

## 2013-04-23 NOTE — Telephone Encounter (Signed)
Called and spoke with kristen at [facility]. She will be faxing over one of their order forms to be completed so the patient can have the echo done through their facility.

## 2013-04-27 ENCOUNTER — Telehealth: Payer: Self-pay | Admitting: Cardiovascular Disease

## 2013-04-27 DIAGNOSIS — R002 Palpitations: Secondary | ICD-10-CM | POA: Diagnosis not present

## 2013-04-27 NOTE — Telephone Encounter (Signed)
Spoke with patient informing him that i have faxed the ECHO order to them twice. He then tells me that they now have it.

## 2013-04-27 NOTE — Telephone Encounter (Signed)
The fax you sent to [facility] and Vascular-they said they have not received it.Did you have this fax number-[phone] [id number]?

## 2013-04-28 ENCOUNTER — Ambulatory Visit (HOSPITAL_COMMUNITY): Payer: Medicare Other

## 2013-04-28 LAB — COMPREHENSIVE METABOLIC PANEL
ALT: 14 U/L (ref 0–53)
CO2: 27 mEq/L (ref 19–32)
Calcium: 9.3 mg/dL (ref 8.4–10.5)
Chloride: 102 mEq/L (ref 96–112)
Creat: 0.86 mg/dL (ref 0.50–1.35)
Total Protein: 6.5 g/dL (ref 6.0–8.3)

## 2013-04-28 LAB — MAGNESIUM: Magnesium: 1.9 mg/dL (ref 1.5–2.5)

## 2013-04-28 LAB — CBC
MCH: 33.6 pg (ref 26.0–34.0)
MCV: 96.1 fL (ref 78.0–100.0)
Platelets: 232 10*3/uL (ref 150–400)
RBC: 4.31 MIL/uL (ref 4.22–5.81)

## 2013-04-29 DIAGNOSIS — R079 Chest pain, unspecified: Secondary | ICD-10-CM | POA: Diagnosis not present

## 2013-04-29 DIAGNOSIS — R002 Palpitations: Secondary | ICD-10-CM | POA: Diagnosis not present

## 2013-04-29 DIAGNOSIS — I059 Rheumatic mitral valve disease, unspecified: Secondary | ICD-10-CM | POA: Diagnosis not present

## 2013-05-04 ENCOUNTER — Telehealth: Payer: Self-pay | Admitting: Cardiovascular Disease

## 2013-05-04 NOTE — Telephone Encounter (Signed)
RN did offer pt a sooner appt to review results and pt declined.

## 2013-05-04 NOTE — Telephone Encounter (Signed)
Returned call.  Pt wanted to know if his echo results had been received from last week.  Stated he had it done at [facility] and they told him it was okay.  Stated they want to see him on Monday the 15th to start him on some kind of patch and he wants to know if he should be doing that or not.  RN asked pt if he had been seen by MD at [facility] and V[facility] and pt stated he had.  Pt advised to select which service he would like to use as both offices appear to provide the same service and he should not be treated by two different MDs for the same condition.  Pt verbalized understanding and agreed w/ plan.  Pt would like to wait for Dr. Tresa Endo to review his labs and the echo that was sent to advise him on what to do.    Message forwarded to Dr. Tresa Endo.  This note on his cart.

## 2013-05-04 NOTE — Telephone Encounter (Signed)
Please call-need to ask you some questions.

## 2013-05-06 ENCOUNTER — Encounter: Payer: Self-pay | Admitting: Cardiovascular Disease

## 2013-05-06 DIAGNOSIS — R002 Palpitations: Secondary | ICD-10-CM | POA: Insufficient documentation

## 2013-05-08 ENCOUNTER — Encounter: Payer: Self-pay | Admitting: *Deleted

## 2013-05-08 NOTE — Progress Notes (Signed)
Quick Note:  Note sent to patient ______ 

## 2013-05-10 DIAGNOSIS — R002 Palpitations: Secondary | ICD-10-CM | POA: Diagnosis not present

## 2013-05-14 ENCOUNTER — Encounter: Payer: Self-pay | Admitting: Cardiovascular Disease

## 2013-05-18 DIAGNOSIS — Z79899 Other long term (current) drug therapy: Secondary | ICD-10-CM | POA: Diagnosis not present

## 2013-05-18 DIAGNOSIS — R002 Palpitations: Secondary | ICD-10-CM | POA: Diagnosis not present

## 2013-05-20 ENCOUNTER — Ambulatory Visit: Payer: Medicare Other | Admitting: Cardiovascular Disease

## 2013-05-20 DIAGNOSIS — Z6825 Body mass index (BMI) 25.0-25.9, adult: Secondary | ICD-10-CM | POA: Diagnosis not present

## 2013-05-20 DIAGNOSIS — E291 Testicular hypofunction: Secondary | ICD-10-CM | POA: Diagnosis not present

## 2013-05-20 DIAGNOSIS — K219 Gastro-esophageal reflux disease without esophagitis: Secondary | ICD-10-CM | POA: Diagnosis not present

## 2013-05-20 DIAGNOSIS — I10 Essential (primary) hypertension: Secondary | ICD-10-CM | POA: Diagnosis not present

## 2013-05-20 DIAGNOSIS — Z0000 Encounter for general adult medical examination without abnormal findings: Secondary | ICD-10-CM | POA: Diagnosis not present

## 2013-05-20 DIAGNOSIS — J449 Chronic obstructive pulmonary disease, unspecified: Secondary | ICD-10-CM | POA: Diagnosis not present

## 2013-06-23 ENCOUNTER — Encounter (INDEPENDENT_AMBULATORY_CARE_PROVIDER_SITE_OTHER): Payer: Self-pay | Admitting: *Deleted

## 2013-06-29 ENCOUNTER — Encounter (INDEPENDENT_AMBULATORY_CARE_PROVIDER_SITE_OTHER): Payer: Self-pay | Admitting: Internal Medicine

## 2013-06-29 ENCOUNTER — Ambulatory Visit (INDEPENDENT_AMBULATORY_CARE_PROVIDER_SITE_OTHER): Payer: Medicare Other | Admitting: Internal Medicine

## 2013-06-29 VITALS — BP 126/60 | HR 80 | Temp 98.0°F | Ht 70.0 in | Wt 173.9 lb

## 2013-06-29 DIAGNOSIS — K4090 Unilateral inguinal hernia, without obstruction or gangrene, not specified as recurrent: Secondary | ICD-10-CM | POA: Diagnosis not present

## 2013-06-29 DIAGNOSIS — E291 Testicular hypofunction: Secondary | ICD-10-CM | POA: Diagnosis not present

## 2013-06-29 NOTE — Patient Instructions (Signed)
Referral to general surgeon for inguinal hernia repair

## 2013-06-29 NOTE — Progress Notes (Signed)
Subjective:     Patient ID: Zachary Gentry, male   DOB: 03/17/1946, 67 y.o.   MRN: [id number]  HPI F/u of abdominal pain. Hx of ileitis. He tells me the first week of September, he had mid-abdominal pain x 3 days and then resolved. Now he tells me he has pain left lower abdomen. He tells me the pain radiates down into his penis.Pain started about 2-3 weeks ago. Really not a pain but a discomfort. If he presses on this area, the pain is slight.   No hematuria. The pain is not associated with voiding.  Appetite is good. He has lost about 12 pounds since his last visit in July. No melena or bright red rectal bleeding. He usually has one stool a day.    05/18/2013 ALP 36, AST 15, ALT 10, total protein 6.8. Albumin4.1, H and H 15.2 and 42.5, MCV 97.7    10/02/2011 Colonoscopy for surveillance of colon polyps:  Examination performed to cecum.  No evidence of recurrent polyps.  Small external hemorrhoids.       04/2012 CT abdomen/pelvis with CM IMPRESSION:  Interval resolution of terminal ileitis since the previous exam. No  new worrisome bowel abnormality seen.  Small right renal cyst, fat containing umbilical and right inguinal  hernias and aortoiliac atherosclerotic change are all stable.   Review of Systems see hpi  Current Outpatient Prescriptions  Medication Sig Dispense Refill  . b complex vitamins tablet Take 1 tablet by mouth daily.      . diazepam (VALIUM) 10 MG tablet Take 5 mg by mouth as needed. For anxiety      . FLUOCINOLONE ACETONIDE BODY 0.01 % external oil Apply topically as needed.       . fluticasone (FLONASE) 50 MCG/ACT nasal spray Place 1 spray into the nose as needed.       Marland Kitchen ibuprofen (ADVIL,MOTRIN) 200 MG tablet Take 400 mg by mouth every 6 (six) hours as needed. For pain      . losartan (COZAAR) 50 MG tablet Take 50 mg by mouth daily.      . metoprolol tartrate (LOPRESSOR) 25 MG tablet Take 1 tablet as needed for palpitations  30 tablet  3  . milk thistle 175 MG  tablet Take 175 mg by mouth daily.      . TESTOSTERONE IM Inject 200 mg into the muscle. Patient rec's this injection every 30 days      . zolpidem (AMBIEN) 10 MG tablet 5 mg as needed.        No current facility-administered medications for this visit.   Past Medical History  Diagnosis Date  . Hypertension   . COPD (chronic obstructive pulmonary disease)   . Hemorrhoids   . Arthritis   . History of colon polyps    Past Surgical History  Procedure Laterality Date  . Back surgery  '79  . Right knee surgery  2007  . Left knee surgery  '96  . Colonoscopy  10/02/2011    Procedure: COLONOSCOPY;  Surgeon: Malissa Hippo, MD;  Location: [facility];  Service: Endoscopy;  Laterality: N/A;  930   No Known Allergies        Objective:   Physical Exam  Filed Vitals:   06/29/13 1458  BP: 126/60  Pulse: 80  Temp: 98 F (36.7 C)  Height: 5\' 10"  (1.778 m)  Weight: 173 lb 14.4 oz (78.881 kg)   Alert and oriented. Skin warm and dry. Oral mucosa is moist.   .  Sclera anicteric, conjunctivae is pink. Thyroid not enlarged. No cervical lymphadenopathy. Lungs clear. Heart regular rate and rhythm.  Abdomen is soft. Bowel sounds are positive. No hepatomegaly. No abdominal masses felt. No tenderness.  No edema to lower extremities.  Swelling to left groin area ( inguinal hernia)     Assessment:   Left  Inguinal hernia.  Dr. Karilyn Cota examined patient also and agrees.     Plan:    Referral to a surgeon for inguinal hernia repair. Cut back of lifting weight.  Consult will be sent to the surgical dept at [facility].

## 2013-07-08 DIAGNOSIS — J449 Chronic obstructive pulmonary disease, unspecified: Secondary | ICD-10-CM | POA: Diagnosis not present

## 2013-07-08 DIAGNOSIS — E291 Testicular hypofunction: Secondary | ICD-10-CM | POA: Diagnosis not present

## 2013-07-08 DIAGNOSIS — M542 Cervicalgia: Secondary | ICD-10-CM | POA: Diagnosis not present

## 2013-07-08 DIAGNOSIS — I10 Essential (primary) hypertension: Secondary | ICD-10-CM | POA: Diagnosis not present

## 2013-07-08 DIAGNOSIS — R002 Palpitations: Secondary | ICD-10-CM | POA: Diagnosis not present

## 2013-07-08 DIAGNOSIS — K4090 Unilateral inguinal hernia, without obstruction or gangrene, not specified as recurrent: Secondary | ICD-10-CM | POA: Diagnosis not present

## 2013-07-08 DIAGNOSIS — K429 Umbilical hernia without obstruction or gangrene: Secondary | ICD-10-CM | POA: Diagnosis not present

## 2013-07-08 DIAGNOSIS — Z01818 Encounter for other preprocedural examination: Secondary | ICD-10-CM | POA: Diagnosis not present

## 2013-07-08 DIAGNOSIS — M549 Dorsalgia, unspecified: Secondary | ICD-10-CM | POA: Diagnosis not present

## 2013-07-08 DIAGNOSIS — M2550 Pain in unspecified joint: Secondary | ICD-10-CM | POA: Diagnosis not present

## 2013-07-08 DIAGNOSIS — Z87891 Personal history of nicotine dependence: Secondary | ICD-10-CM | POA: Diagnosis not present

## 2013-07-15 DIAGNOSIS — Z23 Encounter for immunization: Secondary | ICD-10-CM | POA: Diagnosis not present

## 2013-07-28 DIAGNOSIS — Z7982 Long term (current) use of aspirin: Secondary | ICD-10-CM | POA: Diagnosis not present

## 2013-07-28 DIAGNOSIS — M129 Arthropathy, unspecified: Secondary | ICD-10-CM | POA: Diagnosis not present

## 2013-07-28 DIAGNOSIS — Z87891 Personal history of nicotine dependence: Secondary | ICD-10-CM | POA: Diagnosis not present

## 2013-07-28 DIAGNOSIS — K4030 Unilateral inguinal hernia, with obstruction, without gangrene, not specified as recurrent: Secondary | ICD-10-CM | POA: Diagnosis not present

## 2013-07-28 DIAGNOSIS — Z800 Family history of malignant neoplasm of digestive organs: Secondary | ICD-10-CM | POA: Diagnosis not present

## 2013-07-28 DIAGNOSIS — K429 Umbilical hernia without obstruction or gangrene: Secondary | ICD-10-CM | POA: Diagnosis not present

## 2013-07-28 DIAGNOSIS — Z79899 Other long term (current) drug therapy: Secondary | ICD-10-CM | POA: Diagnosis not present

## 2013-07-28 DIAGNOSIS — K4090 Unilateral inguinal hernia, without obstruction or gangrene, not specified as recurrent: Secondary | ICD-10-CM | POA: Diagnosis not present

## 2013-07-28 DIAGNOSIS — I10 Essential (primary) hypertension: Secondary | ICD-10-CM | POA: Diagnosis not present

## 2013-07-28 DIAGNOSIS — J449 Chronic obstructive pulmonary disease, unspecified: Secondary | ICD-10-CM | POA: Diagnosis not present

## 2013-07-28 DIAGNOSIS — E291 Testicular hypofunction: Secondary | ICD-10-CM | POA: Diagnosis not present

## 2013-07-28 DIAGNOSIS — M2550 Pain in unspecified joint: Secondary | ICD-10-CM | POA: Diagnosis not present

## 2013-08-12 DIAGNOSIS — M19019 Primary osteoarthritis, unspecified shoulder: Secondary | ICD-10-CM | POA: Diagnosis not present

## 2013-08-12 DIAGNOSIS — E291 Testicular hypofunction: Secondary | ICD-10-CM | POA: Diagnosis not present

## 2013-08-12 DIAGNOSIS — Z23 Encounter for immunization: Secondary | ICD-10-CM | POA: Diagnosis not present

## 2013-08-12 DIAGNOSIS — M25519 Pain in unspecified shoulder: Secondary | ICD-10-CM | POA: Diagnosis not present

## 2013-08-26 HISTORY — PX: INGUINAL HERNIA REPAIR: SUR1180

## 2013-09-22 DIAGNOSIS — E291 Testicular hypofunction: Secondary | ICD-10-CM | POA: Diagnosis not present

## 2013-11-05 DIAGNOSIS — E291 Testicular hypofunction: Secondary | ICD-10-CM | POA: Diagnosis not present

## 2014-01-04 DIAGNOSIS — S43429A Sprain of unspecified rotator cuff capsule, initial encounter: Secondary | ICD-10-CM | POA: Diagnosis not present

## 2014-01-04 DIAGNOSIS — M25519 Pain in unspecified shoulder: Secondary | ICD-10-CM | POA: Diagnosis not present

## 2014-01-05 DIAGNOSIS — E291 Testicular hypofunction: Secondary | ICD-10-CM | POA: Diagnosis not present

## 2014-01-25 DIAGNOSIS — R079 Chest pain, unspecified: Secondary | ICD-10-CM | POA: Diagnosis not present

## 2014-01-25 DIAGNOSIS — M1990 Unspecified osteoarthritis, unspecified site: Secondary | ICD-10-CM | POA: Diagnosis not present

## 2014-01-25 DIAGNOSIS — I10 Essential (primary) hypertension: Secondary | ICD-10-CM | POA: Diagnosis not present

## 2014-01-25 DIAGNOSIS — Z6825 Body mass index (BMI) 25.0-25.9, adult: Secondary | ICD-10-CM | POA: Diagnosis not present

## 2014-01-28 DIAGNOSIS — S43429A Sprain of unspecified rotator cuff capsule, initial encounter: Secondary | ICD-10-CM | POA: Diagnosis not present

## 2014-02-01 ENCOUNTER — Other Ambulatory Visit (HOSPITAL_COMMUNITY): Payer: Self-pay | Admitting: Internal Medicine

## 2014-02-01 DIAGNOSIS — R079 Chest pain, unspecified: Secondary | ICD-10-CM

## 2014-02-03 ENCOUNTER — Other Ambulatory Visit (HOSPITAL_COMMUNITY): Payer: Medicare Other

## 2014-02-07 DIAGNOSIS — R079 Chest pain, unspecified: Secondary | ICD-10-CM | POA: Diagnosis not present

## 2014-02-14 DIAGNOSIS — E291 Testicular hypofunction: Secondary | ICD-10-CM | POA: Diagnosis not present

## 2014-02-15 DIAGNOSIS — S43429A Sprain of unspecified rotator cuff capsule, initial encounter: Secondary | ICD-10-CM | POA: Diagnosis not present

## 2014-02-18 ENCOUNTER — Encounter (INDEPENDENT_AMBULATORY_CARE_PROVIDER_SITE_OTHER): Payer: Self-pay

## 2014-03-17 DIAGNOSIS — H40009 Preglaucoma, unspecified, unspecified eye: Secondary | ICD-10-CM | POA: Diagnosis not present

## 2014-04-05 DIAGNOSIS — E236 Other disorders of pituitary gland: Secondary | ICD-10-CM | POA: Diagnosis not present

## 2014-05-25 DIAGNOSIS — M25519 Pain in unspecified shoulder: Secondary | ICD-10-CM | POA: Diagnosis not present

## 2014-05-25 DIAGNOSIS — M1710 Unilateral primary osteoarthritis, unspecified knee: Secondary | ICD-10-CM | POA: Diagnosis not present

## 2014-05-25 DIAGNOSIS — M5137 Other intervertebral disc degeneration, lumbosacral region: Secondary | ICD-10-CM | POA: Diagnosis not present

## 2014-05-26 DIAGNOSIS — E291 Testicular hypofunction: Secondary | ICD-10-CM | POA: Diagnosis not present

## 2014-06-30 DIAGNOSIS — E663 Overweight: Secondary | ICD-10-CM | POA: Diagnosis not present

## 2014-06-30 DIAGNOSIS — I10 Essential (primary) hypertension: Secondary | ICD-10-CM | POA: Diagnosis not present

## 2014-06-30 DIAGNOSIS — J449 Chronic obstructive pulmonary disease, unspecified: Secondary | ICD-10-CM | POA: Diagnosis not present

## 2014-06-30 DIAGNOSIS — Z0000 Encounter for general adult medical examination without abnormal findings: Secondary | ICD-10-CM | POA: Diagnosis not present

## 2014-06-30 DIAGNOSIS — E291 Testicular hypofunction: Secondary | ICD-10-CM | POA: Diagnosis not present

## 2014-06-30 DIAGNOSIS — Z6825 Body mass index (BMI) 25.0-25.9, adult: Secondary | ICD-10-CM | POA: Diagnosis not present

## 2014-07-07 DIAGNOSIS — Z23 Encounter for immunization: Secondary | ICD-10-CM | POA: Diagnosis not present

## 2014-08-04 DIAGNOSIS — E291 Testicular hypofunction: Secondary | ICD-10-CM | POA: Diagnosis not present

## 2014-09-01 DIAGNOSIS — Z1283 Encounter for screening for malignant neoplasm of skin: Secondary | ICD-10-CM | POA: Diagnosis not present

## 2014-09-01 DIAGNOSIS — E291 Testicular hypofunction: Secondary | ICD-10-CM | POA: Diagnosis not present

## 2014-09-21 DIAGNOSIS — M1711 Unilateral primary osteoarthritis, right knee: Secondary | ICD-10-CM | POA: Diagnosis not present

## 2014-09-21 DIAGNOSIS — M25561 Pain in right knee: Secondary | ICD-10-CM | POA: Diagnosis not present

## 2014-09-26 DIAGNOSIS — M1711 Unilateral primary osteoarthritis, right knee: Secondary | ICD-10-CM | POA: Diagnosis not present

## 2014-09-28 DIAGNOSIS — M1711 Unilateral primary osteoarthritis, right knee: Secondary | ICD-10-CM | POA: Diagnosis not present

## 2014-10-13 DIAGNOSIS — E291 Testicular hypofunction: Secondary | ICD-10-CM | POA: Diagnosis not present

## 2014-11-11 DIAGNOSIS — E291 Testicular hypofunction: Secondary | ICD-10-CM | POA: Diagnosis not present

## 2014-11-22 DIAGNOSIS — Z87891 Personal history of nicotine dependence: Secondary | ICD-10-CM | POA: Diagnosis not present

## 2014-11-22 DIAGNOSIS — M179 Osteoarthritis of knee, unspecified: Secondary | ICD-10-CM | POA: Diagnosis not present

## 2014-11-22 DIAGNOSIS — J449 Chronic obstructive pulmonary disease, unspecified: Secondary | ICD-10-CM | POA: Diagnosis present

## 2014-11-22 DIAGNOSIS — I10 Essential (primary) hypertension: Secondary | ICD-10-CM | POA: Diagnosis present

## 2014-11-22 DIAGNOSIS — M1711 Unilateral primary osteoarthritis, right knee: Secondary | ICD-10-CM | POA: Diagnosis not present

## 2014-11-25 DIAGNOSIS — M1711 Unilateral primary osteoarthritis, right knee: Secondary | ICD-10-CM | POA: Diagnosis not present

## 2014-11-25 DIAGNOSIS — Z96651 Presence of right artificial knee joint: Secondary | ICD-10-CM | POA: Diagnosis not present

## 2014-11-30 DIAGNOSIS — Z96651 Presence of right artificial knee joint: Secondary | ICD-10-CM | POA: Diagnosis not present

## 2014-11-30 DIAGNOSIS — M1711 Unilateral primary osteoarthritis, right knee: Secondary | ICD-10-CM | POA: Diagnosis not present

## 2014-12-02 DIAGNOSIS — M1711 Unilateral primary osteoarthritis, right knee: Secondary | ICD-10-CM | POA: Diagnosis not present

## 2014-12-02 DIAGNOSIS — Z96651 Presence of right artificial knee joint: Secondary | ICD-10-CM | POA: Diagnosis not present

## 2014-12-05 DIAGNOSIS — Z96651 Presence of right artificial knee joint: Secondary | ICD-10-CM | POA: Diagnosis not present

## 2014-12-05 DIAGNOSIS — M1711 Unilateral primary osteoarthritis, right knee: Secondary | ICD-10-CM | POA: Diagnosis not present

## 2014-12-09 DIAGNOSIS — M1711 Unilateral primary osteoarthritis, right knee: Secondary | ICD-10-CM | POA: Diagnosis not present

## 2014-12-09 DIAGNOSIS — Z96651 Presence of right artificial knee joint: Secondary | ICD-10-CM | POA: Diagnosis not present

## 2014-12-12 DIAGNOSIS — Z96651 Presence of right artificial knee joint: Secondary | ICD-10-CM | POA: Diagnosis not present

## 2014-12-12 DIAGNOSIS — M1711 Unilateral primary osteoarthritis, right knee: Secondary | ICD-10-CM | POA: Diagnosis not present

## 2014-12-12 DIAGNOSIS — E291 Testicular hypofunction: Secondary | ICD-10-CM | POA: Diagnosis not present

## 2014-12-14 DIAGNOSIS — Z96651 Presence of right artificial knee joint: Secondary | ICD-10-CM | POA: Diagnosis not present

## 2014-12-14 DIAGNOSIS — M1711 Unilateral primary osteoarthritis, right knee: Secondary | ICD-10-CM | POA: Diagnosis not present

## 2014-12-19 DIAGNOSIS — Z96651 Presence of right artificial knee joint: Secondary | ICD-10-CM | POA: Diagnosis not present

## 2014-12-19 DIAGNOSIS — M1711 Unilateral primary osteoarthritis, right knee: Secondary | ICD-10-CM | POA: Diagnosis not present

## 2014-12-21 DIAGNOSIS — M1711 Unilateral primary osteoarthritis, right knee: Secondary | ICD-10-CM | POA: Diagnosis not present

## 2014-12-21 DIAGNOSIS — Z96651 Presence of right artificial knee joint: Secondary | ICD-10-CM | POA: Diagnosis not present

## 2014-12-26 DIAGNOSIS — Z96651 Presence of right artificial knee joint: Secondary | ICD-10-CM | POA: Diagnosis not present

## 2014-12-26 DIAGNOSIS — M1711 Unilateral primary osteoarthritis, right knee: Secondary | ICD-10-CM | POA: Diagnosis not present

## 2014-12-28 DIAGNOSIS — M1711 Unilateral primary osteoarthritis, right knee: Secondary | ICD-10-CM | POA: Diagnosis not present

## 2014-12-28 DIAGNOSIS — Z96651 Presence of right artificial knee joint: Secondary | ICD-10-CM | POA: Diagnosis not present

## 2015-01-02 DIAGNOSIS — M1711 Unilateral primary osteoarthritis, right knee: Secondary | ICD-10-CM | POA: Diagnosis not present

## 2015-01-02 DIAGNOSIS — Z96651 Presence of right artificial knee joint: Secondary | ICD-10-CM | POA: Diagnosis not present

## 2015-01-04 DIAGNOSIS — Z96651 Presence of right artificial knee joint: Secondary | ICD-10-CM | POA: Diagnosis not present

## 2015-01-04 DIAGNOSIS — M1711 Unilateral primary osteoarthritis, right knee: Secondary | ICD-10-CM | POA: Diagnosis not present

## 2015-01-06 DIAGNOSIS — M1711 Unilateral primary osteoarthritis, right knee: Secondary | ICD-10-CM | POA: Diagnosis not present

## 2015-01-06 DIAGNOSIS — Z96651 Presence of right artificial knee joint: Secondary | ICD-10-CM | POA: Diagnosis not present

## 2015-01-09 DIAGNOSIS — E291 Testicular hypofunction: Secondary | ICD-10-CM | POA: Diagnosis not present

## 2015-01-09 DIAGNOSIS — Z96651 Presence of right artificial knee joint: Secondary | ICD-10-CM | POA: Diagnosis not present

## 2015-01-09 DIAGNOSIS — M1711 Unilateral primary osteoarthritis, right knee: Secondary | ICD-10-CM | POA: Diagnosis not present

## 2015-01-11 DIAGNOSIS — Z96651 Presence of right artificial knee joint: Secondary | ICD-10-CM | POA: Diagnosis not present

## 2015-01-11 DIAGNOSIS — M1711 Unilateral primary osteoarthritis, right knee: Secondary | ICD-10-CM | POA: Diagnosis not present

## 2015-02-09 DIAGNOSIS — E291 Testicular hypofunction: Secondary | ICD-10-CM | POA: Diagnosis not present

## 2015-03-10 DIAGNOSIS — E291 Testicular hypofunction: Secondary | ICD-10-CM | POA: Diagnosis not present

## 2015-03-23 DIAGNOSIS — H40013 Open angle with borderline findings, low risk, bilateral: Secondary | ICD-10-CM | POA: Diagnosis not present

## 2015-03-27 DIAGNOSIS — M67431 Ganglion, right wrist: Secondary | ICD-10-CM | POA: Diagnosis not present

## 2015-03-27 DIAGNOSIS — I10 Essential (primary) hypertension: Secondary | ICD-10-CM | POA: Diagnosis not present

## 2015-03-27 DIAGNOSIS — K219 Gastro-esophageal reflux disease without esophagitis: Secondary | ICD-10-CM | POA: Diagnosis not present

## 2015-03-27 DIAGNOSIS — E663 Overweight: Secondary | ICD-10-CM | POA: Diagnosis not present

## 2015-03-27 DIAGNOSIS — J449 Chronic obstructive pulmonary disease, unspecified: Secondary | ICD-10-CM | POA: Diagnosis not present

## 2015-03-27 DIAGNOSIS — Z6825 Body mass index (BMI) 25.0-25.9, adult: Secondary | ICD-10-CM | POA: Diagnosis not present

## 2015-03-27 DIAGNOSIS — G4700 Insomnia, unspecified: Secondary | ICD-10-CM | POA: Diagnosis not present

## 2015-03-27 DIAGNOSIS — Z1389 Encounter for screening for other disorder: Secondary | ICD-10-CM | POA: Diagnosis not present

## 2015-03-28 DIAGNOSIS — S134XXA Sprain of ligaments of cervical spine, initial encounter: Secondary | ICD-10-CM | POA: Diagnosis not present

## 2015-04-10 DIAGNOSIS — E291 Testicular hypofunction: Secondary | ICD-10-CM | POA: Diagnosis not present

## 2015-04-18 DIAGNOSIS — S134XXD Sprain of ligaments of cervical spine, subsequent encounter: Secondary | ICD-10-CM | POA: Diagnosis not present

## 2015-04-24 DIAGNOSIS — S134XXD Sprain of ligaments of cervical spine, subsequent encounter: Secondary | ICD-10-CM | POA: Diagnosis not present

## 2015-04-28 DIAGNOSIS — S134XXD Sprain of ligaments of cervical spine, subsequent encounter: Secondary | ICD-10-CM | POA: Diagnosis not present

## 2015-05-03 DIAGNOSIS — S134XXD Sprain of ligaments of cervical spine, subsequent encounter: Secondary | ICD-10-CM | POA: Diagnosis not present

## 2015-05-05 DIAGNOSIS — S134XXD Sprain of ligaments of cervical spine, subsequent encounter: Secondary | ICD-10-CM | POA: Diagnosis not present

## 2015-05-09 DIAGNOSIS — E291 Testicular hypofunction: Secondary | ICD-10-CM | POA: Diagnosis not present

## 2015-07-08 DIAGNOSIS — S134XXD Sprain of ligaments of cervical spine, subsequent encounter: Secondary | ICD-10-CM | POA: Diagnosis not present

## 2015-07-22 DIAGNOSIS — S46811A Strain of other muscles, fascia and tendons at shoulder and upper arm level, right arm, initial encounter: Secondary | ICD-10-CM | POA: Diagnosis not present

## 2015-08-17 DIAGNOSIS — Z79899 Other long term (current) drug therapy: Secondary | ICD-10-CM | POA: Diagnosis not present

## 2015-08-17 DIAGNOSIS — Z0001 Encounter for general adult medical examination with abnormal findings: Secondary | ICD-10-CM | POA: Diagnosis not present

## 2015-08-17 DIAGNOSIS — Z6826 Body mass index (BMI) 26.0-26.9, adult: Secondary | ICD-10-CM | POA: Diagnosis not present

## 2015-08-17 DIAGNOSIS — E291 Testicular hypofunction: Secondary | ICD-10-CM | POA: Diagnosis not present

## 2015-08-17 DIAGNOSIS — Z1389 Encounter for screening for other disorder: Secondary | ICD-10-CM | POA: Diagnosis not present

## 2015-08-17 DIAGNOSIS — Z0000 Encounter for general adult medical examination without abnormal findings: Secondary | ICD-10-CM | POA: Diagnosis not present

## 2015-08-17 DIAGNOSIS — I499 Cardiac arrhythmia, unspecified: Secondary | ICD-10-CM | POA: Diagnosis not present

## 2015-08-17 DIAGNOSIS — I10 Essential (primary) hypertension: Secondary | ICD-10-CM | POA: Diagnosis not present

## 2015-08-17 DIAGNOSIS — Z23 Encounter for immunization: Secondary | ICD-10-CM | POA: Diagnosis not present

## 2015-08-19 DIAGNOSIS — S46811A Strain of other muscles, fascia and tendons at shoulder and upper arm level, right arm, initial encounter: Secondary | ICD-10-CM | POA: Diagnosis not present

## 2015-09-29 DIAGNOSIS — L570 Actinic keratosis: Secondary | ICD-10-CM | POA: Diagnosis not present

## 2015-09-29 DIAGNOSIS — D1801 Hemangioma of skin and subcutaneous tissue: Secondary | ICD-10-CM | POA: Diagnosis not present

## 2015-09-29 DIAGNOSIS — L821 Other seborrheic keratosis: Secondary | ICD-10-CM | POA: Diagnosis not present

## 2015-10-03 DIAGNOSIS — Z125 Encounter for screening for malignant neoplasm of prostate: Secondary | ICD-10-CM | POA: Diagnosis not present

## 2015-10-03 DIAGNOSIS — E291 Testicular hypofunction: Secondary | ICD-10-CM | POA: Diagnosis not present

## 2015-10-04 DIAGNOSIS — M7551 Bursitis of right shoulder: Secondary | ICD-10-CM | POA: Diagnosis not present

## 2015-10-04 DIAGNOSIS — M7521 Bicipital tendinitis, right shoulder: Secondary | ICD-10-CM | POA: Diagnosis not present

## 2015-10-04 DIAGNOSIS — S46811A Strain of other muscles, fascia and tendons at shoulder and upper arm level, right arm, initial encounter: Secondary | ICD-10-CM | POA: Diagnosis not present

## 2015-10-10 DIAGNOSIS — E291 Testicular hypofunction: Secondary | ICD-10-CM | POA: Diagnosis not present

## 2015-10-12 DIAGNOSIS — M7551 Bursitis of right shoulder: Secondary | ICD-10-CM | POA: Diagnosis not present

## 2015-10-12 DIAGNOSIS — M7521 Bicipital tendinitis, right shoulder: Secondary | ICD-10-CM | POA: Diagnosis not present

## 2015-10-12 DIAGNOSIS — M75111 Incomplete rotator cuff tear or rupture of right shoulder, not specified as traumatic: Secondary | ICD-10-CM | POA: Diagnosis not present

## 2015-10-12 DIAGNOSIS — M19011 Primary osteoarthritis, right shoulder: Secondary | ICD-10-CM | POA: Diagnosis not present

## 2015-10-12 DIAGNOSIS — M75121 Complete rotator cuff tear or rupture of right shoulder, not specified as traumatic: Secondary | ICD-10-CM | POA: Diagnosis not present

## 2015-10-12 DIAGNOSIS — M24111 Other articular cartilage disorders, right shoulder: Secondary | ICD-10-CM | POA: Diagnosis not present

## 2015-11-21 DIAGNOSIS — M7521 Bicipital tendinitis, right shoulder: Secondary | ICD-10-CM | POA: Diagnosis not present

## 2015-11-21 DIAGNOSIS — M75121 Complete rotator cuff tear or rupture of right shoulder, not specified as traumatic: Secondary | ICD-10-CM | POA: Diagnosis not present

## 2015-11-21 DIAGNOSIS — M24111 Other articular cartilage disorders, right shoulder: Secondary | ICD-10-CM | POA: Diagnosis not present

## 2015-11-27 DIAGNOSIS — M75121 Complete rotator cuff tear or rupture of right shoulder, not specified as traumatic: Secondary | ICD-10-CM | POA: Diagnosis not present

## 2015-11-27 DIAGNOSIS — M24111 Other articular cartilage disorders, right shoulder: Secondary | ICD-10-CM | POA: Diagnosis not present

## 2015-11-27 DIAGNOSIS — M7521 Bicipital tendinitis, right shoulder: Secondary | ICD-10-CM | POA: Diagnosis not present

## 2015-11-28 DIAGNOSIS — I10 Essential (primary) hypertension: Secondary | ICD-10-CM | POA: Diagnosis not present

## 2015-11-28 DIAGNOSIS — M1991 Primary osteoarthritis, unspecified site: Secondary | ICD-10-CM | POA: Diagnosis not present

## 2015-11-28 DIAGNOSIS — Z1389 Encounter for screening for other disorder: Secondary | ICD-10-CM | POA: Diagnosis not present

## 2015-11-28 DIAGNOSIS — E663 Overweight: Secondary | ICD-10-CM | POA: Diagnosis not present

## 2015-11-28 DIAGNOSIS — Z6826 Body mass index (BMI) 26.0-26.9, adult: Secondary | ICD-10-CM | POA: Diagnosis not present

## 2015-11-28 DIAGNOSIS — E291 Testicular hypofunction: Secondary | ICD-10-CM | POA: Diagnosis not present

## 2015-11-30 DIAGNOSIS — M24111 Other articular cartilage disorders, right shoulder: Secondary | ICD-10-CM | POA: Diagnosis not present

## 2015-11-30 DIAGNOSIS — M7521 Bicipital tendinitis, right shoulder: Secondary | ICD-10-CM | POA: Diagnosis not present

## 2015-11-30 DIAGNOSIS — M75121 Complete rotator cuff tear or rupture of right shoulder, not specified as traumatic: Secondary | ICD-10-CM | POA: Diagnosis not present

## 2015-12-05 DIAGNOSIS — M7521 Bicipital tendinitis, right shoulder: Secondary | ICD-10-CM | POA: Diagnosis not present

## 2015-12-05 DIAGNOSIS — M24111 Other articular cartilage disorders, right shoulder: Secondary | ICD-10-CM | POA: Diagnosis not present

## 2015-12-05 DIAGNOSIS — M75121 Complete rotator cuff tear or rupture of right shoulder, not specified as traumatic: Secondary | ICD-10-CM | POA: Diagnosis not present

## 2015-12-07 DIAGNOSIS — M75121 Complete rotator cuff tear or rupture of right shoulder, not specified as traumatic: Secondary | ICD-10-CM | POA: Diagnosis not present

## 2015-12-07 DIAGNOSIS — M7521 Bicipital tendinitis, right shoulder: Secondary | ICD-10-CM | POA: Diagnosis not present

## 2015-12-07 DIAGNOSIS — M24111 Other articular cartilage disorders, right shoulder: Secondary | ICD-10-CM | POA: Diagnosis not present

## 2015-12-14 DIAGNOSIS — R7989 Other specified abnormal findings of blood chemistry: Secondary | ICD-10-CM | POA: Diagnosis not present

## 2015-12-15 DIAGNOSIS — M75121 Complete rotator cuff tear or rupture of right shoulder, not specified as traumatic: Secondary | ICD-10-CM | POA: Diagnosis not present

## 2015-12-15 DIAGNOSIS — M24111 Other articular cartilage disorders, right shoulder: Secondary | ICD-10-CM | POA: Diagnosis not present

## 2015-12-15 DIAGNOSIS — M7521 Bicipital tendinitis, right shoulder: Secondary | ICD-10-CM | POA: Diagnosis not present

## 2015-12-19 DIAGNOSIS — M7521 Bicipital tendinitis, right shoulder: Secondary | ICD-10-CM | POA: Diagnosis not present

## 2015-12-19 DIAGNOSIS — M24111 Other articular cartilage disorders, right shoulder: Secondary | ICD-10-CM | POA: Diagnosis not present

## 2015-12-19 DIAGNOSIS — M75121 Complete rotator cuff tear or rupture of right shoulder, not specified as traumatic: Secondary | ICD-10-CM | POA: Diagnosis not present

## 2015-12-21 DIAGNOSIS — M75121 Complete rotator cuff tear or rupture of right shoulder, not specified as traumatic: Secondary | ICD-10-CM | POA: Diagnosis not present

## 2015-12-21 DIAGNOSIS — M24111 Other articular cartilage disorders, right shoulder: Secondary | ICD-10-CM | POA: Diagnosis not present

## 2015-12-21 DIAGNOSIS — M7521 Bicipital tendinitis, right shoulder: Secondary | ICD-10-CM | POA: Diagnosis not present

## 2015-12-27 DIAGNOSIS — M24111 Other articular cartilage disorders, right shoulder: Secondary | ICD-10-CM | POA: Diagnosis not present

## 2015-12-27 DIAGNOSIS — M7521 Bicipital tendinitis, right shoulder: Secondary | ICD-10-CM | POA: Diagnosis not present

## 2015-12-27 DIAGNOSIS — M75121 Complete rotator cuff tear or rupture of right shoulder, not specified as traumatic: Secondary | ICD-10-CM | POA: Diagnosis not present

## 2015-12-29 DIAGNOSIS — M75121 Complete rotator cuff tear or rupture of right shoulder, not specified as traumatic: Secondary | ICD-10-CM | POA: Diagnosis not present

## 2015-12-29 DIAGNOSIS — M7521 Bicipital tendinitis, right shoulder: Secondary | ICD-10-CM | POA: Diagnosis not present

## 2015-12-29 DIAGNOSIS — M24111 Other articular cartilage disorders, right shoulder: Secondary | ICD-10-CM | POA: Diagnosis not present

## 2016-01-02 DIAGNOSIS — M7521 Bicipital tendinitis, right shoulder: Secondary | ICD-10-CM | POA: Diagnosis not present

## 2016-01-02 DIAGNOSIS — M24111 Other articular cartilage disorders, right shoulder: Secondary | ICD-10-CM | POA: Diagnosis not present

## 2016-01-02 DIAGNOSIS — M75121 Complete rotator cuff tear or rupture of right shoulder, not specified as traumatic: Secondary | ICD-10-CM | POA: Diagnosis not present

## 2016-01-03 DIAGNOSIS — R7989 Other specified abnormal findings of blood chemistry: Secondary | ICD-10-CM | POA: Diagnosis not present

## 2016-01-04 DIAGNOSIS — M75121 Complete rotator cuff tear or rupture of right shoulder, not specified as traumatic: Secondary | ICD-10-CM | POA: Diagnosis not present

## 2016-01-04 DIAGNOSIS — M7521 Bicipital tendinitis, right shoulder: Secondary | ICD-10-CM | POA: Diagnosis not present

## 2016-01-04 DIAGNOSIS — M24111 Other articular cartilage disorders, right shoulder: Secondary | ICD-10-CM | POA: Diagnosis not present

## 2016-01-09 DIAGNOSIS — M24111 Other articular cartilage disorders, right shoulder: Secondary | ICD-10-CM | POA: Diagnosis not present

## 2016-01-09 DIAGNOSIS — M75121 Complete rotator cuff tear or rupture of right shoulder, not specified as traumatic: Secondary | ICD-10-CM | POA: Diagnosis not present

## 2016-01-09 DIAGNOSIS — M7521 Bicipital tendinitis, right shoulder: Secondary | ICD-10-CM | POA: Diagnosis not present

## 2016-01-11 DIAGNOSIS — M75121 Complete rotator cuff tear or rupture of right shoulder, not specified as traumatic: Secondary | ICD-10-CM | POA: Diagnosis not present

## 2016-01-11 DIAGNOSIS — M24111 Other articular cartilage disorders, right shoulder: Secondary | ICD-10-CM | POA: Diagnosis not present

## 2016-01-11 DIAGNOSIS — M7521 Bicipital tendinitis, right shoulder: Secondary | ICD-10-CM | POA: Diagnosis not present

## 2016-01-30 DIAGNOSIS — M7521 Bicipital tendinitis, right shoulder: Secondary | ICD-10-CM | POA: Diagnosis not present

## 2016-01-30 DIAGNOSIS — M24111 Other articular cartilage disorders, right shoulder: Secondary | ICD-10-CM | POA: Diagnosis not present

## 2016-01-30 DIAGNOSIS — M75121 Complete rotator cuff tear or rupture of right shoulder, not specified as traumatic: Secondary | ICD-10-CM | POA: Diagnosis not present

## 2016-02-01 DIAGNOSIS — M24111 Other articular cartilage disorders, right shoulder: Secondary | ICD-10-CM | POA: Diagnosis not present

## 2016-02-01 DIAGNOSIS — M75121 Complete rotator cuff tear or rupture of right shoulder, not specified as traumatic: Secondary | ICD-10-CM | POA: Diagnosis not present

## 2016-02-01 DIAGNOSIS — M7521 Bicipital tendinitis, right shoulder: Secondary | ICD-10-CM | POA: Diagnosis not present

## 2016-02-06 DIAGNOSIS — M7521 Bicipital tendinitis, right shoulder: Secondary | ICD-10-CM | POA: Diagnosis not present

## 2016-02-06 DIAGNOSIS — M75121 Complete rotator cuff tear or rupture of right shoulder, not specified as traumatic: Secondary | ICD-10-CM | POA: Diagnosis not present

## 2016-02-06 DIAGNOSIS — M24111 Other articular cartilage disorders, right shoulder: Secondary | ICD-10-CM | POA: Diagnosis not present

## 2016-02-08 DIAGNOSIS — M7521 Bicipital tendinitis, right shoulder: Secondary | ICD-10-CM | POA: Diagnosis not present

## 2016-02-08 DIAGNOSIS — M24111 Other articular cartilage disorders, right shoulder: Secondary | ICD-10-CM | POA: Diagnosis not present

## 2016-02-08 DIAGNOSIS — M75121 Complete rotator cuff tear or rupture of right shoulder, not specified as traumatic: Secondary | ICD-10-CM | POA: Diagnosis not present

## 2016-02-13 DIAGNOSIS — M24111 Other articular cartilage disorders, right shoulder: Secondary | ICD-10-CM | POA: Diagnosis not present

## 2016-02-13 DIAGNOSIS — M75121 Complete rotator cuff tear or rupture of right shoulder, not specified as traumatic: Secondary | ICD-10-CM | POA: Diagnosis not present

## 2016-02-13 DIAGNOSIS — M7521 Bicipital tendinitis, right shoulder: Secondary | ICD-10-CM | POA: Diagnosis not present

## 2016-02-14 DIAGNOSIS — R7989 Other specified abnormal findings of blood chemistry: Secondary | ICD-10-CM | POA: Diagnosis not present

## 2016-02-15 DIAGNOSIS — M75121 Complete rotator cuff tear or rupture of right shoulder, not specified as traumatic: Secondary | ICD-10-CM | POA: Diagnosis not present

## 2016-02-15 DIAGNOSIS — M24111 Other articular cartilage disorders, right shoulder: Secondary | ICD-10-CM | POA: Diagnosis not present

## 2016-02-15 DIAGNOSIS — M7521 Bicipital tendinitis, right shoulder: Secondary | ICD-10-CM | POA: Diagnosis not present

## 2016-02-20 DIAGNOSIS — M75121 Complete rotator cuff tear or rupture of right shoulder, not specified as traumatic: Secondary | ICD-10-CM | POA: Diagnosis not present

## 2016-02-20 DIAGNOSIS — M7521 Bicipital tendinitis, right shoulder: Secondary | ICD-10-CM | POA: Diagnosis not present

## 2016-02-20 DIAGNOSIS — M24111 Other articular cartilage disorders, right shoulder: Secondary | ICD-10-CM | POA: Diagnosis not present

## 2016-03-05 DIAGNOSIS — M24111 Other articular cartilage disorders, right shoulder: Secondary | ICD-10-CM | POA: Diagnosis not present

## 2016-03-05 DIAGNOSIS — M7521 Bicipital tendinitis, right shoulder: Secondary | ICD-10-CM | POA: Diagnosis not present

## 2016-03-05 DIAGNOSIS — M75121 Complete rotator cuff tear or rupture of right shoulder, not specified as traumatic: Secondary | ICD-10-CM | POA: Diagnosis not present

## 2016-03-06 DIAGNOSIS — M24111 Other articular cartilage disorders, right shoulder: Secondary | ICD-10-CM | POA: Diagnosis not present

## 2016-03-06 DIAGNOSIS — M7521 Bicipital tendinitis, right shoulder: Secondary | ICD-10-CM | POA: Diagnosis not present

## 2016-03-06 DIAGNOSIS — M75121 Complete rotator cuff tear or rupture of right shoulder, not specified as traumatic: Secondary | ICD-10-CM | POA: Diagnosis not present

## 2016-03-12 DIAGNOSIS — R7989 Other specified abnormal findings of blood chemistry: Secondary | ICD-10-CM | POA: Diagnosis not present

## 2016-04-02 DIAGNOSIS — H40023 Open angle with borderline findings, high risk, bilateral: Secondary | ICD-10-CM | POA: Diagnosis not present

## 2016-04-09 DIAGNOSIS — J449 Chronic obstructive pulmonary disease, unspecified: Secondary | ICD-10-CM | POA: Diagnosis not present

## 2016-04-09 DIAGNOSIS — Z6826 Body mass index (BMI) 26.0-26.9, adult: Secondary | ICD-10-CM | POA: Diagnosis not present

## 2016-04-09 DIAGNOSIS — K219 Gastro-esophageal reflux disease without esophagitis: Secondary | ICD-10-CM | POA: Diagnosis not present

## 2016-04-09 DIAGNOSIS — E291 Testicular hypofunction: Secondary | ICD-10-CM | POA: Diagnosis not present

## 2016-04-09 DIAGNOSIS — Z1389 Encounter for screening for other disorder: Secondary | ICD-10-CM | POA: Diagnosis not present

## 2016-04-09 DIAGNOSIS — M1991 Primary osteoarthritis, unspecified site: Secondary | ICD-10-CM | POA: Diagnosis not present

## 2016-04-23 ENCOUNTER — Encounter (INDEPENDENT_AMBULATORY_CARE_PROVIDER_SITE_OTHER): Payer: Self-pay | Admitting: Internal Medicine

## 2016-04-25 ENCOUNTER — Encounter (INDEPENDENT_AMBULATORY_CARE_PROVIDER_SITE_OTHER): Payer: Self-pay | Admitting: Internal Medicine

## 2016-04-25 ENCOUNTER — Encounter (INDEPENDENT_AMBULATORY_CARE_PROVIDER_SITE_OTHER): Payer: Self-pay | Admitting: *Deleted

## 2016-04-25 ENCOUNTER — Ambulatory Visit (INDEPENDENT_AMBULATORY_CARE_PROVIDER_SITE_OTHER): Payer: Medicare Other | Admitting: Internal Medicine

## 2016-04-25 VITALS — BP 134/80 | HR 60 | Temp 97.0°F | Ht 70.0 in | Wt 181.9 lb

## 2016-04-25 DIAGNOSIS — K219 Gastro-esophageal reflux disease without esophagitis: Secondary | ICD-10-CM | POA: Diagnosis not present

## 2016-04-25 DIAGNOSIS — R1013 Epigastric pain: Secondary | ICD-10-CM | POA: Diagnosis not present

## 2016-04-25 LAB — CBC WITH DIFFERENTIAL/PLATELET
BASOS ABS: 67 {cells}/uL (ref 0–200)
Basophils Relative: 1 %
EOS ABS: 134 {cells}/uL (ref 15–500)
Eosinophils Relative: 2 %
HEMATOCRIT: 45.5 % (ref 38.5–50.0)
HEMOGLOBIN: 15.7 g/dL (ref 13.2–17.1)
LYMPHS ABS: 1273 {cells}/uL (ref 850–3900)
LYMPHS PCT: 19 %
MCH: 33.5 pg — AB (ref 27.0–33.0)
MCHC: 34.5 g/dL (ref 32.0–36.0)
MCV: 97 fL (ref 80.0–100.0)
MONO ABS: 469 {cells}/uL (ref 200–950)
MPV: 10.3 fL (ref 7.5–12.5)
Monocytes Relative: 7 %
NEUTROS PCT: 71 %
Neutro Abs: 4757 cells/uL (ref 1500–7800)
Platelets: 271 10*3/uL (ref 140–400)
RBC: 4.69 MIL/uL (ref 4.20–5.80)
RDW: 14 % (ref 11.0–15.0)
WBC: 6.7 10*3/uL (ref 3.8–10.8)

## 2016-04-25 LAB — HEPATIC FUNCTION PANEL
ALT: 12 U/L (ref 9–46)
AST: 16 U/L (ref 10–35)
Albumin: 4 g/dL (ref 3.6–5.1)
Alkaline Phosphatase: 41 U/L (ref 40–115)
BILIRUBIN DIRECT: 0.2 mg/dL (ref <=0.2)
Indirect Bilirubin: 0.8 mg/dL (ref 0.2–1.2)
TOTAL PROTEIN: 6.9 g/dL (ref 6.1–8.1)
Total Bilirubin: 1 mg/dL (ref 0.2–1.2)

## 2016-04-25 LAB — LIPASE: LIPASE: 19 U/L (ref 7–60)

## 2016-04-25 MED ORDER — PANTOPRAZOLE SODIUM 40 MG PO TBEC: 40.0000 mg | DELAYED_RELEASE_TABLET | Freq: Every day | ORAL | 1 refills | Status: DC

## 2016-04-25 NOTE — Patient Instructions (Signed)
US abdomen 

## 2016-04-25 NOTE — Progress Notes (Signed)
05/28/2012 CT abdomen/pelvis with CM:   Clinical Data: Follow-up ileitis.  No current abdominal complaints following antibiotic treatment IMPRESSION: Interval resolution of terminal ileitis since the previous exam. No new worrisome bowel abnormality seen.

## 2016-04-25 NOTE — Progress Notes (Signed)
   Subjective:    Patient ID: Zachary Gentry, male    DOB: [birth year], 70 y.o.   MRN: [id number]  HPI On March 29, 2016, he tells me he ate a tomato sandwich and his stomach ripped wide open. He thinks it was too much acid. He says he waited 7 days and ate another tomato sandwich and since then he stomach has been unsettled. He says he feels like he had an upset stomach.  He has eaten [place] without any problem. He has a nauseous feeling.   05/28/2012 CT abdomen/pelvis with CM: ilelitis IMPRESSION: Interval resolution of terminal ileitis since the previous exam. No new worrisome bowel abnormality seen.    Review of Systems Past Medical History:  Diagnosis Date  . Arthritis   . COPD (chronic obstructive pulmonary disease) ([place])   . Hemorrhoids   . History of colon polyps   . Hypertension     Past Surgical History:  Procedure Laterality Date  . BACK SURGERY  '79  . COLONOSCOPY  10/02/2011   Procedure: COLONOSCOPY;  Surgeon: Rogene Houston, MD;  Location: [facility];  Service: Endoscopy;  Laterality: N/A;  930  . left knee surgery  '96  . right knee surgery  2007    No Known Allergies  Current Outpatient Prescriptions on File Prior to Visit  Medication Sig Dispense Refill  . b complex vitamins tablet Take 1 tablet by mouth as needed.     . diazepam (VALIUM) 10 MG tablet Take 5 mg by mouth as needed. For anxiety    . FLUOCINOLONE ACETONIDE BODY 0.01 % external oil Apply topically as needed.     Marland Kitchen losartan (COZAAR) 50 MG tablet Take 50 mg by mouth daily.    . TESTOSTERONE IM Inject 200 mg into the muscle. Patient rec's this injection every 30 days    . zolpidem (AMBIEN) 10 MG tablet 5 mg as needed.      No current facility-administered medications on file prior to visit.        Objective:   Physical Exam Blood pressure 134/80, pulse 60, temperature 97 F (36.1 C), height 5\' 10"  (1.778 m), weight 181 lb 14.4 oz (82.5 kg). Alert and oriented. Skin warm and dry. Oral  mucosa is moist.   . Sclera anicteric, conjunctivae is pink. Thyroid not enlarged. No cervical lymphadenopathy. Lungs clear. Heart regular rate and rhythm.  Abdomen is soft. Bowel sounds are positive. No hepatomegaly. No abdominal masses felt. No tenderness.  No edema to lower extremities.         Assessment & Plan:  Possible GERD. Protonix 40mg  BID. US abdomen.

## 2016-04-30 ENCOUNTER — Ambulatory Visit (HOSPITAL_COMMUNITY)
Admission: RE | Admit: 2016-04-30 | Discharge: 2016-04-30 | Disposition: A | Discharge status: Home / Self Care | Payer: Medicare Other | Source: Ambulatory Visit | Attending: Internal Medicine | Admitting: Internal Medicine

## 2016-04-30 ENCOUNTER — Ambulatory Visit (INDEPENDENT_AMBULATORY_CARE_PROVIDER_SITE_OTHER): Payer: Medicare Other | Admitting: Internal Medicine

## 2016-04-30 DIAGNOSIS — R1013 Epigastric pain: Secondary | ICD-10-CM | POA: Insufficient documentation

## 2016-04-30 DIAGNOSIS — N281 Cyst of kidney, acquired: Secondary | ICD-10-CM | POA: Insufficient documentation

## 2016-07-24 DIAGNOSIS — E291 Testicular hypofunction: Secondary | ICD-10-CM | POA: Diagnosis not present

## 2016-07-24 DIAGNOSIS — Z23 Encounter for immunization: Secondary | ICD-10-CM | POA: Diagnosis not present

## 2016-08-06 ENCOUNTER — Other Ambulatory Visit (INDEPENDENT_AMBULATORY_CARE_PROVIDER_SITE_OTHER): Payer: Self-pay | Admitting: Internal Medicine

## 2016-08-06 DIAGNOSIS — K219 Gastro-esophageal reflux disease without esophagitis: Secondary | ICD-10-CM

## 2016-08-21 DIAGNOSIS — M5030 Other cervical disc degeneration, unspecified cervical region: Secondary | ICD-10-CM | POA: Diagnosis not present

## 2016-08-27 DIAGNOSIS — M5030 Other cervical disc degeneration, unspecified cervical region: Secondary | ICD-10-CM | POA: Diagnosis not present

## 2016-09-04 ENCOUNTER — Other Ambulatory Visit (HOSPITAL_COMMUNITY): Payer: Self-pay | Admitting: Internal Medicine

## 2016-09-04 ENCOUNTER — Ambulatory Visit (HOSPITAL_COMMUNITY)
Admission: RE | Admit: 2016-09-04 | Discharge: 2016-09-04 | Disposition: A | Discharge status: Home / Self Care | Payer: Medicare Other | Source: Ambulatory Visit | Attending: Internal Medicine | Admitting: Internal Medicine

## 2016-09-04 DIAGNOSIS — R05 Cough: Secondary | ICD-10-CM

## 2016-09-04 DIAGNOSIS — E291 Testicular hypofunction: Secondary | ICD-10-CM | POA: Diagnosis not present

## 2016-09-04 DIAGNOSIS — Z125 Encounter for screening for malignant neoplasm of prostate: Secondary | ICD-10-CM | POA: Diagnosis not present

## 2016-09-04 DIAGNOSIS — M1991 Primary osteoarthritis, unspecified site: Secondary | ICD-10-CM | POA: Diagnosis not present

## 2016-09-04 DIAGNOSIS — Z6825 Body mass index (BMI) 25.0-25.9, adult: Secondary | ICD-10-CM | POA: Diagnosis not present

## 2016-09-04 DIAGNOSIS — J449 Chronic obstructive pulmonary disease, unspecified: Secondary | ICD-10-CM | POA: Diagnosis not present

## 2016-09-04 DIAGNOSIS — R059 Cough, unspecified: Secondary | ICD-10-CM

## 2016-09-04 DIAGNOSIS — Z1389 Encounter for screening for other disorder: Secondary | ICD-10-CM | POA: Diagnosis not present

## 2016-09-04 DIAGNOSIS — E663 Overweight: Secondary | ICD-10-CM | POA: Diagnosis not present

## 2016-09-04 DIAGNOSIS — K219 Gastro-esophageal reflux disease without esophagitis: Secondary | ICD-10-CM | POA: Diagnosis not present

## 2016-09-04 DIAGNOSIS — I10 Essential (primary) hypertension: Secondary | ICD-10-CM | POA: Diagnosis not present

## 2016-09-04 DIAGNOSIS — Z0001 Encounter for general adult medical examination with abnormal findings: Secondary | ICD-10-CM | POA: Diagnosis not present

## 2016-09-19 DIAGNOSIS — L820 Inflamed seborrheic keratosis: Secondary | ICD-10-CM | POA: Diagnosis not present

## 2016-09-24 ENCOUNTER — Encounter (INDEPENDENT_AMBULATORY_CARE_PROVIDER_SITE_OTHER): Payer: Self-pay | Admitting: *Deleted

## 2016-10-02 DIAGNOSIS — E291 Testicular hypofunction: Secondary | ICD-10-CM | POA: Diagnosis not present

## 2016-10-02 DIAGNOSIS — R7989 Other specified abnormal findings of blood chemistry: Secondary | ICD-10-CM | POA: Diagnosis not present

## 2016-10-16 ENCOUNTER — Encounter (INDEPENDENT_AMBULATORY_CARE_PROVIDER_SITE_OTHER): Payer: Self-pay | Admitting: *Deleted

## 2016-10-16 ENCOUNTER — Telehealth (INDEPENDENT_AMBULATORY_CARE_PROVIDER_SITE_OTHER): Payer: Self-pay | Admitting: *Deleted

## 2016-10-16 MED ORDER — PEG 3350-KCL-NA BICARB-NACL 420 G PO SOLR: 4000.0000 mL | Freq: Once | ORAL | 0 refills | Status: AC

## 2016-10-16 NOTE — Telephone Encounter (Signed)
Referring MD/PCP: fusco   Procedure: tcs  Reason/Indication:  Hx polyps  Has patient had this procedure before?  Yes, 2013  If so, when, by whom and where?    Is there a family history of colon cancer?  no  Who?  What age when diagnosed?    Is patient diabetic?   no      Does patient have prosthetic heart valve or mechanical valve?  no  Do you have a pacemaker?  no  Has patient ever had endocarditis? no  Has patient had joint replacement within last 12 months?  no  Does patient tend to be constipated or take laxatives? no  Does patient have a history of alcohol/drug use?  no  Is patient on Coumadin, Plavix and/or Aspirin? no  Medications: see epic  Allergies: nkda  Medication Adjustment:   Procedure date & time: 11/06/16 at 200

## 2016-10-16 NOTE — Telephone Encounter (Signed)
Patient needs trilyte 

## 2016-10-16 NOTE — Telephone Encounter (Signed)
agree

## 2016-10-17 ENCOUNTER — Other Ambulatory Visit (INDEPENDENT_AMBULATORY_CARE_PROVIDER_SITE_OTHER): Payer: Self-pay | Admitting: *Deleted

## 2016-10-17 DIAGNOSIS — Z86010 Personal history of colonic polyps: Secondary | ICD-10-CM | POA: Insufficient documentation

## 2016-10-31 DIAGNOSIS — K048 Radicular cyst: Secondary | ICD-10-CM | POA: Diagnosis not present

## 2016-11-06 ENCOUNTER — Encounter (HOSPITAL_COMMUNITY)
Admission: RE | Disposition: A | Discharge status: Home / Self Care | Payer: Self-pay | Source: Ambulatory Visit | Attending: Internal Medicine

## 2016-11-06 ENCOUNTER — Ambulatory Visit (HOSPITAL_COMMUNITY)
Admission: RE | Admit: 2016-11-06 | Discharge: 2016-11-06 | Disposition: A | Discharge status: Home / Self Care | Payer: Medicare Other | Source: Ambulatory Visit | Attending: Internal Medicine | Admitting: Internal Medicine

## 2016-11-06 ENCOUNTER — Encounter (HOSPITAL_COMMUNITY): Payer: Self-pay | Admitting: *Deleted

## 2016-11-06 DIAGNOSIS — Z79899 Other long term (current) drug therapy: Secondary | ICD-10-CM | POA: Diagnosis not present

## 2016-11-06 DIAGNOSIS — M1990 Unspecified osteoarthritis, unspecified site: Secondary | ICD-10-CM | POA: Diagnosis not present

## 2016-11-06 DIAGNOSIS — Z87891 Personal history of nicotine dependence: Secondary | ICD-10-CM | POA: Insufficient documentation

## 2016-11-06 DIAGNOSIS — Z86010 Personal history of colonic polyps: Secondary | ICD-10-CM | POA: Diagnosis not present

## 2016-11-06 DIAGNOSIS — J449 Chronic obstructive pulmonary disease, unspecified: Secondary | ICD-10-CM | POA: Insufficient documentation

## 2016-11-06 DIAGNOSIS — I10 Essential (primary) hypertension: Secondary | ICD-10-CM | POA: Insufficient documentation

## 2016-11-06 DIAGNOSIS — K644 Residual hemorrhoidal skin tags: Secondary | ICD-10-CM | POA: Insufficient documentation

## 2016-11-06 DIAGNOSIS — Z7982 Long term (current) use of aspirin: Secondary | ICD-10-CM | POA: Diagnosis not present

## 2016-11-06 DIAGNOSIS — Z09 Encounter for follow-up examination after completed treatment for conditions other than malignant neoplasm: Secondary | ICD-10-CM | POA: Diagnosis not present

## 2016-11-06 DIAGNOSIS — Z1211 Encounter for screening for malignant neoplasm of colon: Secondary | ICD-10-CM | POA: Diagnosis not present

## 2016-11-06 DIAGNOSIS — E291 Testicular hypofunction: Secondary | ICD-10-CM | POA: Diagnosis not present

## 2016-11-06 DIAGNOSIS — Z8505 Personal history of malignant neoplasm of liver: Secondary | ICD-10-CM | POA: Diagnosis not present

## 2016-11-06 DIAGNOSIS — R7989 Other specified abnormal findings of blood chemistry: Secondary | ICD-10-CM | POA: Diagnosis not present

## 2016-11-06 HISTORY — PX: COLONOSCOPY: SHX5424

## 2016-11-06 SURGERY — COLONOSCOPY
Anesthesia: Moderate Sedation

## 2016-11-06 MED ORDER — STERILE WATER FOR IRRIGATION IR SOLN
Status: DC | PRN
  Administered 2016-11-06: 14:00:00

## 2016-11-06 MED ORDER — MEPERIDINE HCL 50 MG/ML IJ SOLN
INTRAMUSCULAR | Status: DC | PRN
  Administered 2016-11-06 (×3): 25 mg via INTRAVENOUS

## 2016-11-06 MED ORDER — MIDAZOLAM HCL 5 MG/5ML IJ SOLN
INTRAMUSCULAR | Status: AC
  Filled 2016-11-06: qty 10

## 2016-11-06 MED ORDER — MEPERIDINE HCL 50 MG/ML IJ SOLN
INTRAMUSCULAR | Status: AC
  Filled 2016-11-06: qty 1

## 2016-11-06 MED ORDER — MIDAZOLAM HCL 5 MG/5ML IJ SOLN
INTRAMUSCULAR | Status: DC | PRN
Start: 2016-11-06 — End: 2016-11-06
  Administered 2016-11-06 (×2): 2 mg via INTRAVENOUS
  Administered 2016-11-06 (×2): 3 mg via INTRAVENOUS

## 2016-11-06 MED ORDER — SODIUM CHLORIDE 0.9 % IV SOLN
INTRAVENOUS | Status: DC
  Administered 2016-11-06: 1000 mL via INTRAVENOUS

## 2016-11-06 NOTE — H&P (Signed)
Zachary Gentry is an 71 y.o. male.   Chief Complaint: Patient is here for colonoscopy. HPI: Patient is 71 year old Caucasian male who has history of colonic polyps and is here for several years colonoscopy. Last exam was 5 years ago. He denies abdominal pain change in bowel habits or rectal bleeding. Family history is negative for CRC.  Past Medical History:  Diagnosis Date  . Arthritis   . COPD (chronic obstructive pulmonary disease) (Zachary Gentry)   . Hemorrhoids   . History of colon polyps   . Hypertension     Past Surgical History:  Procedure Laterality Date  . BACK SURGERY  '79  . COLONOSCOPY  10/02/2011   Procedure: COLONOSCOPY;  Surgeon: Rogene Houston, MD;  Location: [facility];  Service: Endoscopy;  Laterality: N/A;  930  . left knee surgery  '96  . right knee surgery  2007  . SHOULDER SURGERY      Family History  Problem Relation Age of Onset  . Liver cancer Mother   . Healthy Sister   . Healthy Brother    Social History:  reports that he has quit smoking. His smoking use included Cigarettes. He has a 6.25 pack-year smoking history. He has never used smokeless tobacco. He reports that he drinks alcohol. He reports that he does not use drugs.  Allergies: No Known Allergies  Medications Prior to Admission  Medication Sig Dispense Refill  . amoxicillin (AMOXIL) 500 MG capsule Take 500 mg by mouth 3 (three) times daily.    . ARTIFICIAL TEAR OP Apply 1 drop to eye 4 (four) times daily.    Marland Kitchen aspirin EC 81 MG tablet Take 81 mg by mouth daily.    . diazepam (VALIUM) 10 MG tablet Take 5 mg by mouth daily as needed for anxiety. For anxiety    . Fluocinolone Acetonide Scalp 0.01 % OIL Apply 1 application topically once a week.    . fluticasone (FLONASE) 50 MCG/ACT nasal spray Place 1-2 sprays into both nostrils daily as needed for allergies or rhinitis.    Marland Kitchen losartan (COZAAR) 50 MG tablet Take 50 mg by mouth daily.    . meloxicam (MOBIC) 15 MG tablet Take 15 mg by mouth daily. With  food    . Multiple Vitamins-Minerals (EMERGEN-C VITAMIN C) PACK Take 1 Dose by mouth daily as needed (immune support).    . pantoprazole (PROTONIX) 40 MG tablet TAKE ONE TABLET BY MOUTH ONCE DAILY 60 tablet 5  . TESTOSTERONE IM Inject 200 mg into the muscle. Patient rec's this injection every 30 days    . zolpidem (AMBIEN) 10 MG tablet Take 5 mg by mouth at bedtime as needed for sleep.       No results found for this or any previous visit (from the past 48 hour(s)). No results found.  ROS  Blood pressure 133/85, pulse 61, temperature 97.4 F (36.3 C), temperature source Oral, resp. rate 12, height 5\' 10"  (1.778 m), weight 180 lb (81.6 kg), SpO2 97 %. Physical Exam  Constitutional: He appears well-developed and well-nourished.  HENT:  Mouth/Throat: Oropharynx is clear and moist.  Eyes: Conjunctivae are normal. No scleral icterus.  Neck: No thyromegaly present.  Cardiovascular: Normal rate, regular rhythm and normal heart sounds.   No murmur heard. Respiratory: Effort normal and breath sounds normal.  GI: Soft. He exhibits no distension and no mass. There is no tenderness.  Musculoskeletal: He exhibits no tenderness.  Lymphadenopathy:    He has no cervical adenopathy.  Neurological:  He is alert.  Skin: Skin is warm and dry.     Assessment/Plan History of colonic adenomas. Surveillance colonoscopy  Hildred Laser, MD 11/06/2016, 2:21 PM

## 2016-11-06 NOTE — Op Note (Signed)
[facility] Patient Name: Zachary Gentry Procedure Date: 11/06/2016 2:02 PM MRN: [id number] Date of Birth: 10-Jul-1946 Attending MD: Hildred Laser , MD CSN: [id number] Age: 71 Admit Type: Ambulatory Procedure:                Colonoscopy Indications:              High risk colon cancer surveillance: Personal                            history of colonic polyps Providers:                Hildred Laser, MD, Otis Peak B. Sharon Seller, RN, Lurline Del, RN Referring MD:             Redmond School, MD Medicines:                Meperidine 75 mg IV, Midazolam 10 mg IV Complications:            No immediate complications. Estimated Blood Loss:     Estimated blood loss: none. Procedure:                Pre-Anesthesia Assessment:                           - Prior to the procedure, a History and Physical                            was performed, and patient medications and                            allergies were reviewed. The patient's tolerance of                            previous anesthesia was also reviewed. The risks                            and benefits of the procedure and the sedation                            options and risks were discussed with the patient.                            All questions were answered, and informed consent                            was obtained. Prior Anticoagulants: The patient                            last took aspirin 3 days prior to the procedure.                            ASA Grade Assessment: II - A patient with mild  systemic disease. After reviewing the risks and                            benefits, the patient was deemed in satisfactory                            condition to undergo the procedure.                           After obtaining informed consent, the colonoscope                            was passed under direct vision. Throughout the                            procedure, the patient's  blood pressure, pulse, and                            oxygen saturations were monitored continuously. The                            [id number] ([id number]) was introduced through the                            anus and advanced to the the cecum, identified by                            appendiceal orifice and ileocecal valve. The                            colonoscopy was performed without difficulty. The                            patient tolerated the procedure well. The quality                            of the bowel preparation was adequate to identify                            polyps. The ileocecal valve, appendiceal orifice,                            and rectum were photographed. Scope In: 2:33:11 PM Scope Out: 2:53:22 PM Scope Withdrawal Time: 0 hours 11 minutes 16 seconds  Total Procedure Duration: 0 hours 20 minutes 11 seconds  Findings:      The perianal and digital rectal examinations were normal.      The colon (entire examined portion) appeared normal.      External hemorrhoids were found during retroflexion. The hemorrhoids       were small. Impression:               - The entire examined colon is normal.                           - External  hemorrhoids.                           - No specimens collected. Moderate Sedation:      Moderate (conscious) sedation was administered by the endoscopy nurse       and supervised by the endoscopist. The following parameters were       monitored: oxygen saturation, heart rate, blood pressure, CO2       capnography and response to care. Total physician intraservice time was       27 minutes. Recommendation:           - Patient has a contact number available for                            emergencies. The signs and symptoms of potential                            delayed complications were discussed with the                            patient. Return to normal activities tomorrow.                            Written discharge  instructions were provided to the                            patient.                           - Resume previous diet today.                           - Continue present medications.                           - Repeat colonoscopy in 7 years for surveillance. Procedure Code(s):        --- Professional ---                           [phone], Colonoscopy, flexible; diagnostic, including                            collection of specimen(s) by brushing or washing,                            when performed (separate procedure)                           99152, Moderate sedation services provided by the                            same physician or other qualified health care                            professional performing the diagnostic or  therapeutic service that the sedation supports,                            requiring the presence of an independent trained                            observer to assist in the monitoring of the                            patient's level of consciousness and physiological                            status; initial 15 minutes of intraservice time,                            patient age 29 years or older                           [phone], Moderate sedation services; each additional                            15 minutes intraservice time Diagnosis Code(s):        --- Professional ---                           Z86.010, Personal history of colonic polyps                           K64.4, Residual hemorrhoidal skin tags CPT copyright 2016 American Medical Association. All rights reserved. The codes documented in this report are preliminary and upon coder review may  be revised to meet current compliance requirements. Hildred Laser, MD Hildred Laser, MD 11/06/2016 2:59:07 PM This report has been signed electronically. Number of Addenda: 0

## 2016-11-06 NOTE — Discharge Instructions (Signed)
Colonoscopy, Adult, Care After This sheet gives you information about how to care for yourself after your procedure. Your health care provider may also give you more specific instructions. If you have problems or questions, contact your health care provider. What can I expect after the procedure? After the procedure, it is common to have:  A small amount of blood in your stool for 24 hours after the procedure.  Some gas.  Mild abdominal cramping or bloating. Follow these instructions at home: General instructions    For the first 24 hours after the procedure:  Do not drive or use machinery.  Do not sign important documents.  Do not drink alcohol.  Do your regular daily activities at a slower pace than normal.  Eat soft, easy-to-digest foods.  Rest often.  Take over-the-counter or prescription medicines only as told by your health care provider.  It is up to you to get the results of your procedure. Ask your health care provider, or the department performing the procedure, when your results will be ready. Relieving cramping and bloating   Try walking around when you have cramps or feel bloated.  Apply heat to your abdomen as told by your health care provider. Use a heat source that your health care provider recommends, such as a moist heat pack or a heating pad.  Place a towel between your skin and the heat source.  Leave the heat on for 20-30 minutes.  Remove the heat if your skin turns bright red. This is especially important if you are unable to feel pain, heat, or cold. You may have a greater risk of getting burned. Eating and drinking   Drink enough fluid to keep your urine clear or pale yellow.  Resume your normal diet as instructed by your health care provider. Avoid heavy or fried foods that are hard to digest.  Avoid drinking alcohol for as long as instructed by your health care provider. Contact a health care provider if:  You have blood in your stool 2-3  days after the procedure. Get help right away if:  You have more than a small spotting of blood in your stool.  You pass large blood clots in your stool.  Your abdomen is swollen.  You have nausea or vomiting.  You have a fever.  You have increasing abdominal pain that is not relieved with medicine. This information is not intended to replace advice given to you by your health care provider. Make sure you discuss any questions you have with your health care provider. Document Released: 03/26/2004 Document Revised: 05/06/2016 Document Reviewed: 10/24/2015 Elsevier Interactive Patient Education  2017 [place] usual medications and diet. No driving for 24 hours. Next colonoscopy in 7 years.

## 2016-11-08 ENCOUNTER — Encounter (HOSPITAL_COMMUNITY): Payer: Self-pay | Admitting: Internal Medicine

## 2016-12-05 DIAGNOSIS — R7989 Other specified abnormal findings of blood chemistry: Secondary | ICD-10-CM | POA: Diagnosis not present

## 2016-12-12 DIAGNOSIS — M19012 Primary osteoarthritis, left shoulder: Secondary | ICD-10-CM | POA: Diagnosis not present

## 2016-12-12 DIAGNOSIS — M7542 Impingement syndrome of left shoulder: Secondary | ICD-10-CM | POA: Diagnosis not present

## 2016-12-12 DIAGNOSIS — M19011 Primary osteoarthritis, right shoulder: Secondary | ICD-10-CM | POA: Diagnosis not present

## 2016-12-26 DIAGNOSIS — Z87891 Personal history of nicotine dependence: Secondary | ICD-10-CM | POA: Diagnosis not present

## 2016-12-26 DIAGNOSIS — M5031 Other cervical disc degeneration,  high cervical region: Secondary | ICD-10-CM | POA: Diagnosis not present

## 2016-12-26 DIAGNOSIS — M542 Cervicalgia: Secondary | ICD-10-CM | POA: Diagnosis not present

## 2016-12-26 DIAGNOSIS — M47812 Spondylosis without myelopathy or radiculopathy, cervical region: Secondary | ICD-10-CM | POA: Diagnosis not present

## 2016-12-26 DIAGNOSIS — G8929 Other chronic pain: Secondary | ICD-10-CM | POA: Diagnosis not present

## 2016-12-26 DIAGNOSIS — S161XXS Strain of muscle, fascia and tendon at neck level, sequela: Secondary | ICD-10-CM | POA: Diagnosis not present

## 2017-01-01 DIAGNOSIS — E291 Testicular hypofunction: Secondary | ICD-10-CM | POA: Diagnosis not present

## 2017-01-24 DIAGNOSIS — M19011 Primary osteoarthritis, right shoulder: Secondary | ICD-10-CM | POA: Diagnosis not present

## 2017-01-24 DIAGNOSIS — M7542 Impingement syndrome of left shoulder: Secondary | ICD-10-CM | POA: Diagnosis not present

## 2017-01-24 DIAGNOSIS — M19012 Primary osteoarthritis, left shoulder: Secondary | ICD-10-CM | POA: Diagnosis not present

## 2017-01-24 DIAGNOSIS — M7541 Impingement syndrome of right shoulder: Secondary | ICD-10-CM | POA: Diagnosis not present

## 2017-01-30 DIAGNOSIS — Z139 Encounter for screening, unspecified: Secondary | ICD-10-CM | POA: Diagnosis not present

## 2017-01-30 DIAGNOSIS — R7989 Other specified abnormal findings of blood chemistry: Secondary | ICD-10-CM | POA: Diagnosis not present

## 2017-02-04 DIAGNOSIS — M25511 Pain in right shoulder: Secondary | ICD-10-CM | POA: Diagnosis not present

## 2017-02-04 DIAGNOSIS — M25512 Pain in left shoulder: Secondary | ICD-10-CM | POA: Diagnosis not present

## 2017-02-28 DIAGNOSIS — E291 Testicular hypofunction: Secondary | ICD-10-CM | POA: Diagnosis not present

## 2017-02-28 DIAGNOSIS — R7989 Other specified abnormal findings of blood chemistry: Secondary | ICD-10-CM | POA: Diagnosis not present

## 2017-03-28 IMAGING — DX DG CHEST 2V
3 series · 3 of 3 positions shown · non-contrast
Comparison: 04/08/2013

CLINICAL DATA: Cough, COPD, hypertension

EXAM:
CHEST  2 VIEW

[chest pa (1 of 2)]
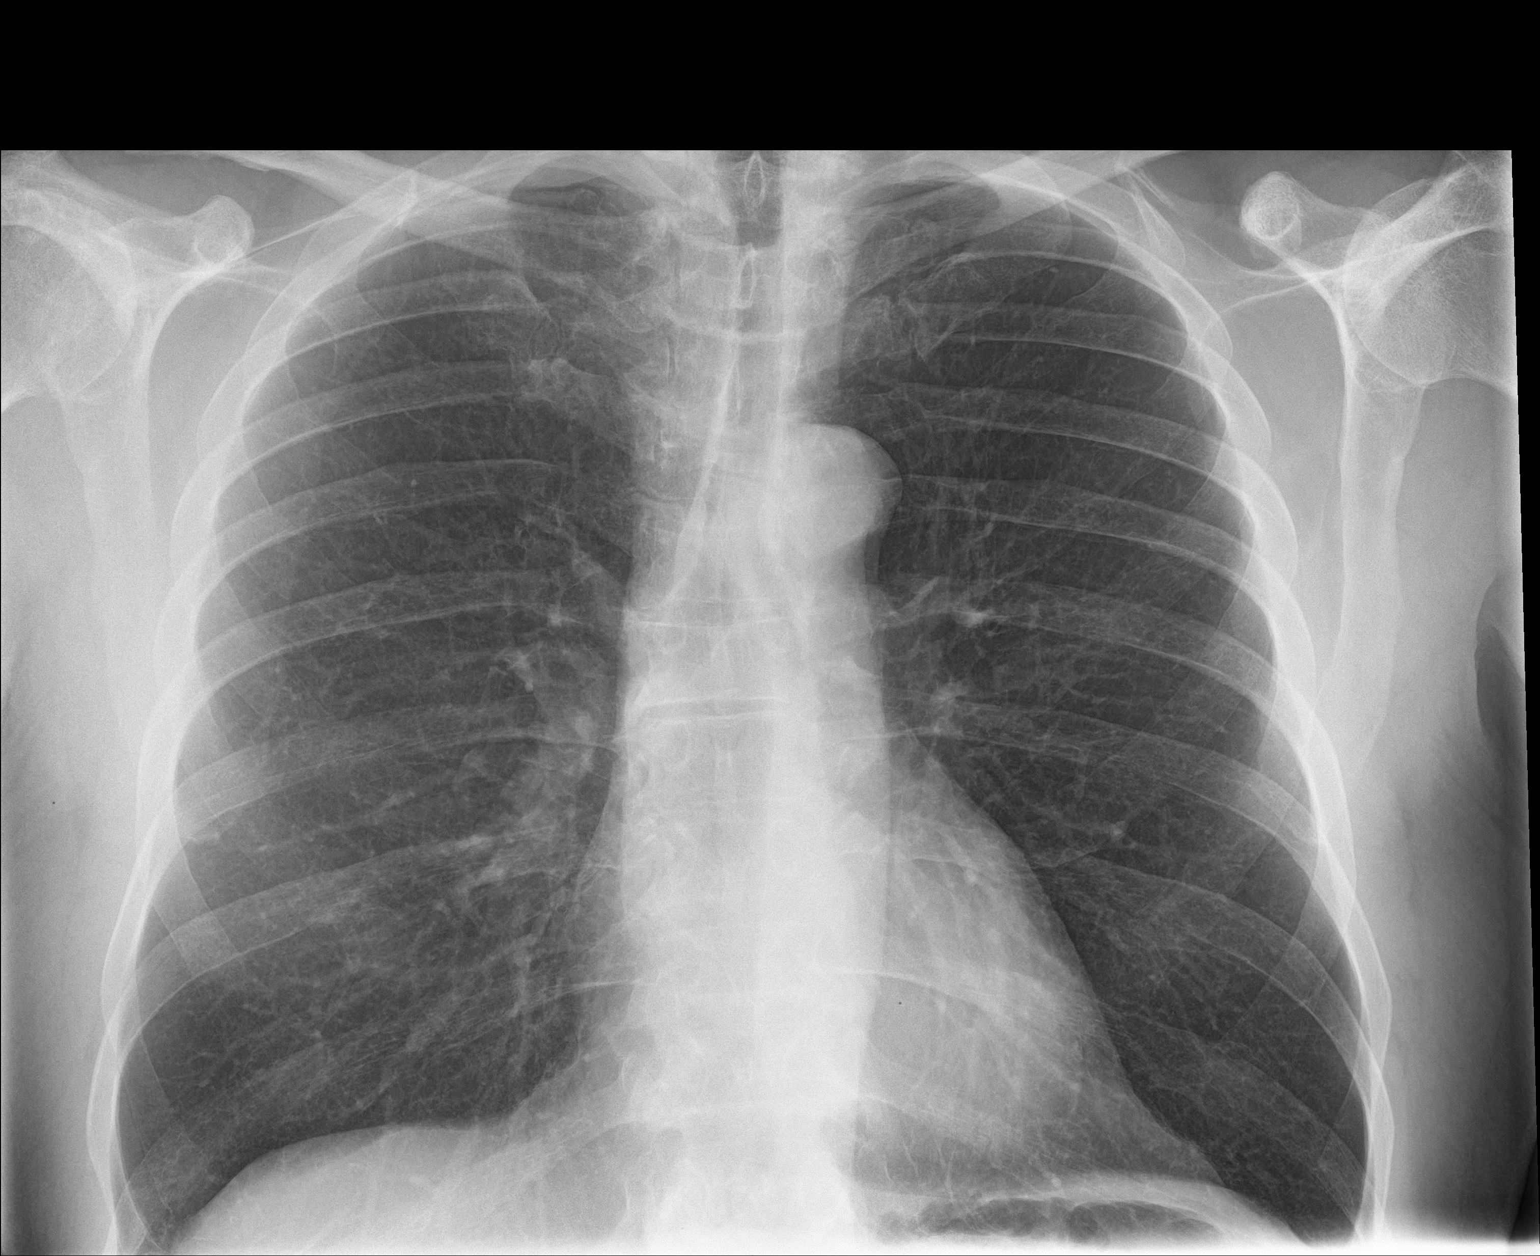

[chest lat]
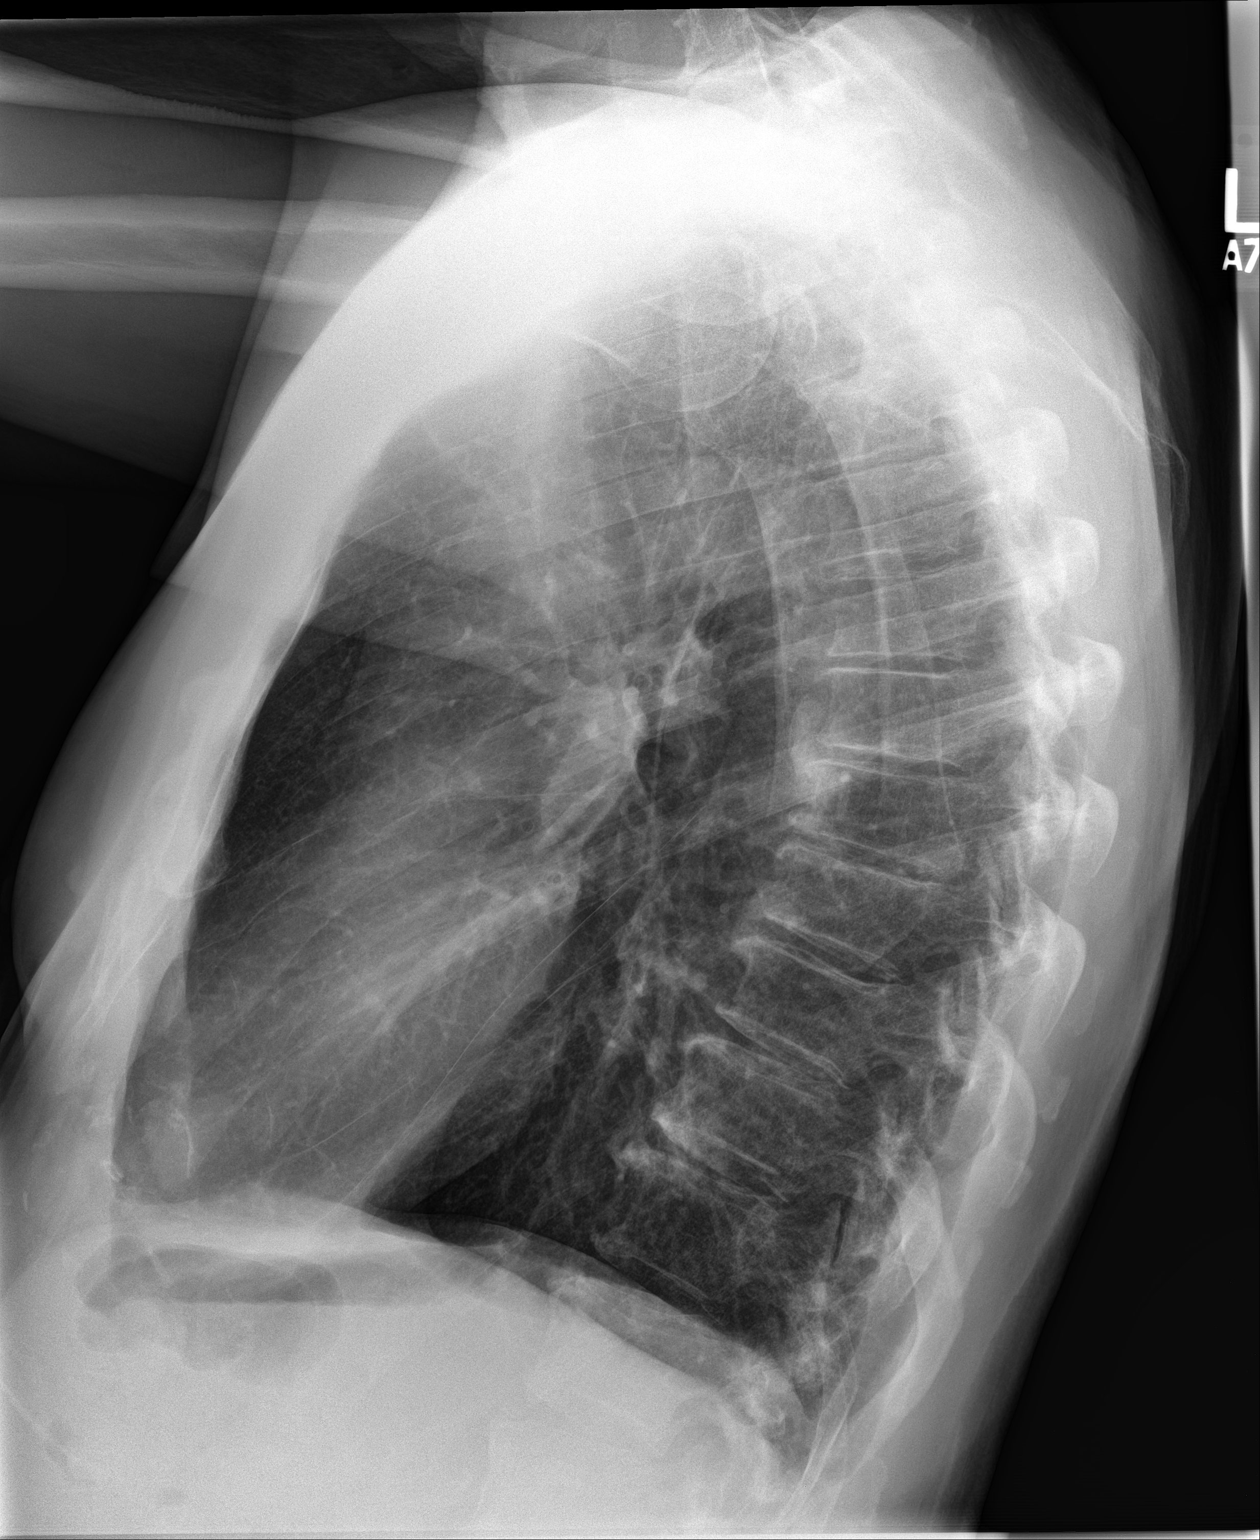

[chest pa (2 of 2)]
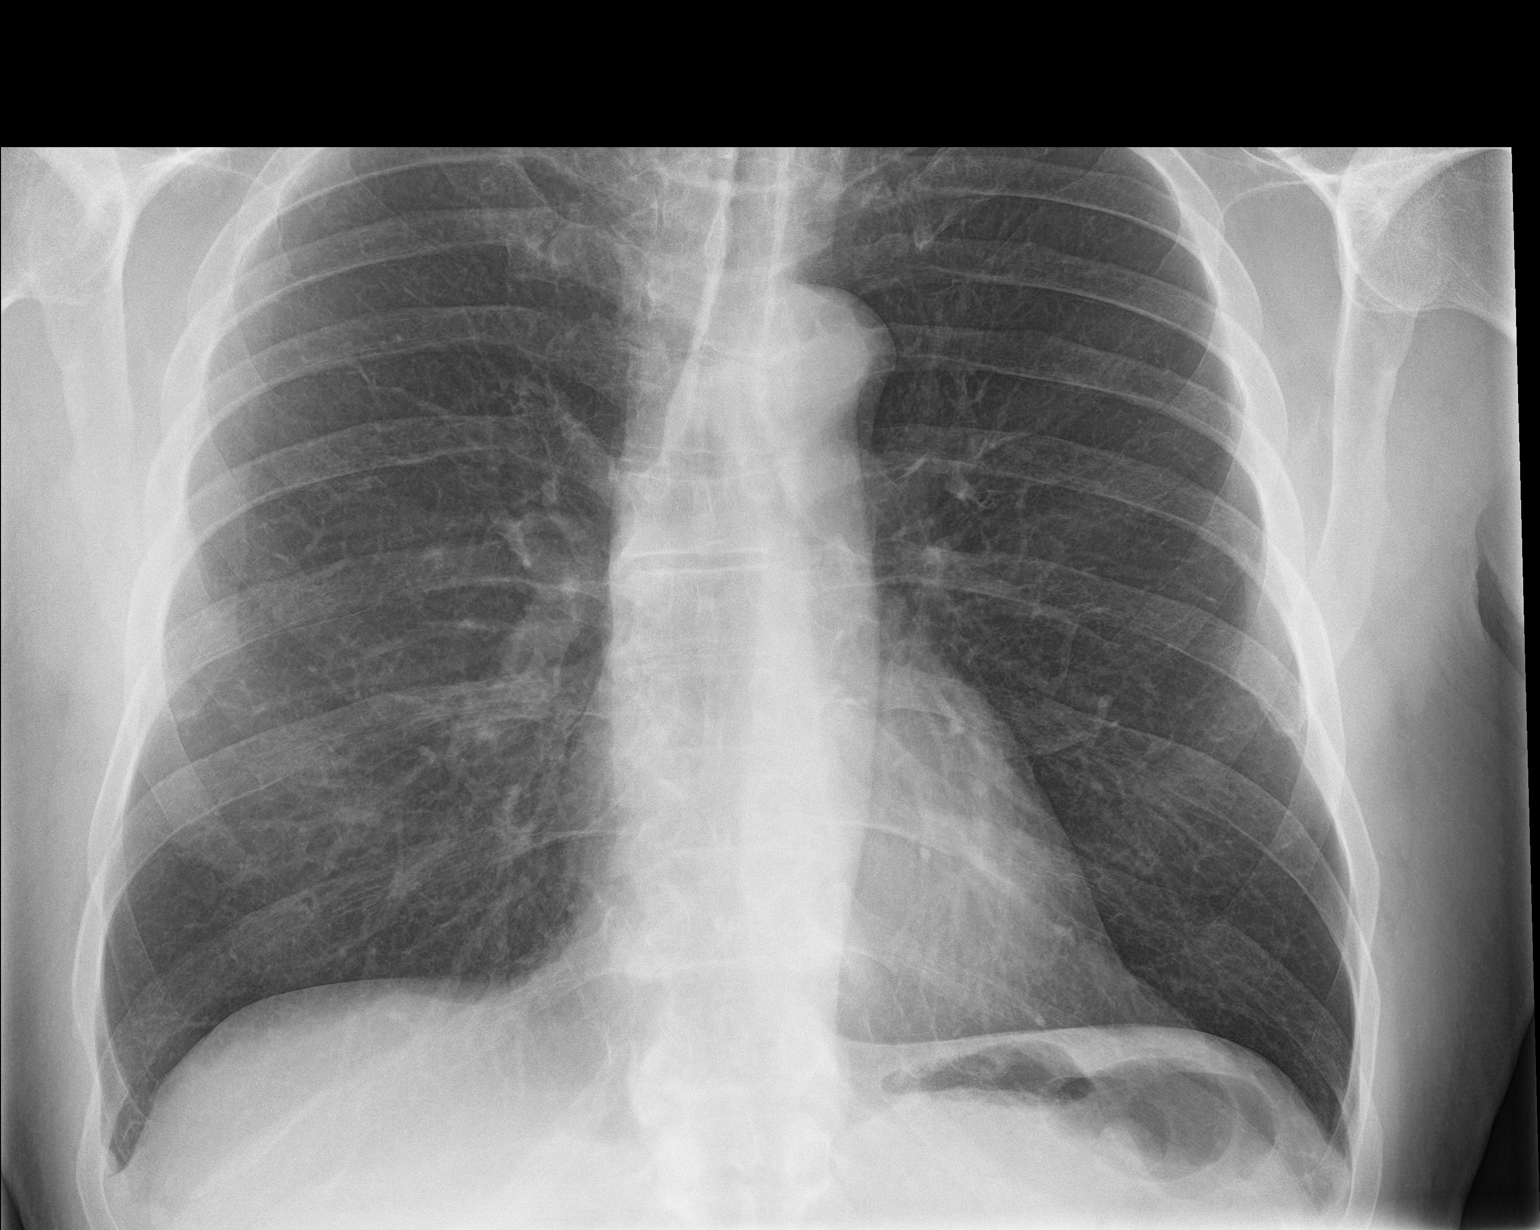

[3 of 3 positions shown; findings below may reference images not displayed]

FINDINGS: Normal heart size, mediastinal contours, and pulmonary vascularity.

Lungs emphysematous but clear.

No pulmonary infiltrate, pleural effusion or pneumothorax.

Scattered degenerative disc disease changes thoracic spine.

No acute osseous findings.
IMPRESSION: COPD changes without acute infiltrate.

## 2017-04-01 DIAGNOSIS — E663 Overweight: Secondary | ICD-10-CM | POA: Diagnosis not present

## 2017-04-01 DIAGNOSIS — R7989 Other specified abnormal findings of blood chemistry: Secondary | ICD-10-CM | POA: Diagnosis not present

## 2017-04-01 DIAGNOSIS — Z1389 Encounter for screening for other disorder: Secondary | ICD-10-CM | POA: Diagnosis not present

## 2017-04-01 DIAGNOSIS — Z6825 Body mass index (BMI) 25.0-25.9, adult: Secondary | ICD-10-CM | POA: Diagnosis not present

## 2017-04-01 DIAGNOSIS — H6123 Impacted cerumen, bilateral: Secondary | ICD-10-CM | POA: Diagnosis not present

## 2017-04-01 DIAGNOSIS — R7309 Other abnormal glucose: Secondary | ICD-10-CM | POA: Diagnosis not present

## 2017-04-01 DIAGNOSIS — G608 Other hereditary and idiopathic neuropathies: Secondary | ICD-10-CM | POA: Diagnosis not present

## 2017-04-08 DIAGNOSIS — H40023 Open angle with borderline findings, high risk, bilateral: Secondary | ICD-10-CM | POA: Diagnosis not present

## 2017-05-01 DIAGNOSIS — E291 Testicular hypofunction: Secondary | ICD-10-CM | POA: Diagnosis not present

## 2017-07-01 DIAGNOSIS — F419 Anxiety disorder, unspecified: Secondary | ICD-10-CM | POA: Diagnosis not present

## 2017-07-01 DIAGNOSIS — Z23 Encounter for immunization: Secondary | ICD-10-CM | POA: Diagnosis not present

## 2017-07-01 DIAGNOSIS — F5101 Primary insomnia: Secondary | ICD-10-CM | POA: Diagnosis not present

## 2017-07-01 DIAGNOSIS — E663 Overweight: Secondary | ICD-10-CM | POA: Diagnosis not present

## 2017-07-01 DIAGNOSIS — E291 Testicular hypofunction: Secondary | ICD-10-CM | POA: Diagnosis not present

## 2017-07-01 DIAGNOSIS — Z6825 Body mass index (BMI) 25.0-25.9, adult: Secondary | ICD-10-CM | POA: Diagnosis not present

## 2017-07-31 DIAGNOSIS — I10 Essential (primary) hypertension: Secondary | ICD-10-CM | POA: Diagnosis not present

## 2017-07-31 DIAGNOSIS — G4700 Insomnia, unspecified: Secondary | ICD-10-CM | POA: Diagnosis not present

## 2017-07-31 DIAGNOSIS — Z6826 Body mass index (BMI) 26.0-26.9, adult: Secondary | ICD-10-CM | POA: Diagnosis not present

## 2017-07-31 DIAGNOSIS — E663 Overweight: Secondary | ICD-10-CM | POA: Diagnosis not present

## 2017-07-31 DIAGNOSIS — E291 Testicular hypofunction: Secondary | ICD-10-CM | POA: Diagnosis not present

## 2017-08-06 DIAGNOSIS — M7551 Bursitis of right shoulder: Secondary | ICD-10-CM | POA: Diagnosis not present

## 2017-08-06 DIAGNOSIS — M19011 Primary osteoarthritis, right shoulder: Secondary | ICD-10-CM | POA: Diagnosis not present

## 2017-08-06 DIAGNOSIS — M7552 Bursitis of left shoulder: Secondary | ICD-10-CM | POA: Diagnosis not present

## 2017-09-02 ENCOUNTER — Other Ambulatory Visit (INDEPENDENT_AMBULATORY_CARE_PROVIDER_SITE_OTHER): Payer: Self-pay | Admitting: Internal Medicine

## 2017-09-02 DIAGNOSIS — K219 Gastro-esophageal reflux disease without esophagitis: Secondary | ICD-10-CM

## 2017-09-10 DIAGNOSIS — M75102 Unspecified rotator cuff tear or rupture of left shoulder, not specified as traumatic: Secondary | ICD-10-CM | POA: Diagnosis not present

## 2017-09-10 DIAGNOSIS — Z1389 Encounter for screening for other disorder: Secondary | ICD-10-CM | POA: Diagnosis not present

## 2017-09-10 DIAGNOSIS — E663 Overweight: Secondary | ICD-10-CM | POA: Diagnosis not present

## 2017-09-10 DIAGNOSIS — K219 Gastro-esophageal reflux disease without esophagitis: Secondary | ICD-10-CM | POA: Diagnosis not present

## 2017-09-10 DIAGNOSIS — R002 Palpitations: Secondary | ICD-10-CM | POA: Diagnosis not present

## 2017-09-10 DIAGNOSIS — F419 Anxiety disorder, unspecified: Secondary | ICD-10-CM | POA: Diagnosis not present

## 2017-09-10 DIAGNOSIS — E291 Testicular hypofunction: Secondary | ICD-10-CM | POA: Diagnosis not present

## 2017-09-10 DIAGNOSIS — Z6825 Body mass index (BMI) 25.0-25.9, adult: Secondary | ICD-10-CM | POA: Diagnosis not present

## 2017-09-10 DIAGNOSIS — M1991 Primary osteoarthritis, unspecified site: Secondary | ICD-10-CM | POA: Diagnosis not present

## 2017-09-10 DIAGNOSIS — E063 Autoimmune thyroiditis: Secondary | ICD-10-CM | POA: Diagnosis not present

## 2017-09-10 DIAGNOSIS — R351 Nocturia: Secondary | ICD-10-CM | POA: Diagnosis not present

## 2017-09-10 DIAGNOSIS — Z0000 Encounter for general adult medical examination without abnormal findings: Secondary | ICD-10-CM | POA: Diagnosis not present

## 2017-09-10 DIAGNOSIS — I10 Essential (primary) hypertension: Secondary | ICD-10-CM | POA: Diagnosis not present

## 2017-09-10 DIAGNOSIS — E782 Mixed hyperlipidemia: Secondary | ICD-10-CM | POA: Diagnosis not present

## 2017-10-01 DIAGNOSIS — R7989 Other specified abnormal findings of blood chemistry: Secondary | ICD-10-CM | POA: Diagnosis not present

## 2017-10-22 DIAGNOSIS — E162 Hypoglycemia, unspecified: Secondary | ICD-10-CM | POA: Diagnosis not present

## 2017-10-22 DIAGNOSIS — R7989 Other specified abnormal findings of blood chemistry: Secondary | ICD-10-CM | POA: Diagnosis not present

## 2017-11-11 DIAGNOSIS — R7989 Other specified abnormal findings of blood chemistry: Secondary | ICD-10-CM | POA: Diagnosis not present

## 2017-11-12 DIAGNOSIS — M7552 Bursitis of left shoulder: Secondary | ICD-10-CM | POA: Diagnosis not present

## 2017-11-12 DIAGNOSIS — M7551 Bursitis of right shoulder: Secondary | ICD-10-CM | POA: Diagnosis not present

## 2017-12-02 DIAGNOSIS — R7989 Other specified abnormal findings of blood chemistry: Secondary | ICD-10-CM | POA: Diagnosis not present

## 2017-12-09 ENCOUNTER — Ambulatory Visit (HOSPITAL_COMMUNITY)
Admission: RE | Admit: 2017-12-09 | Discharge: 2017-12-09 | Disposition: A | Discharge status: Home / Self Care | Payer: Medicare Other | Source: Ambulatory Visit | Attending: Internal Medicine | Admitting: Internal Medicine

## 2017-12-09 ENCOUNTER — Other Ambulatory Visit (HOSPITAL_COMMUNITY): Payer: Self-pay | Admitting: Internal Medicine

## 2017-12-09 DIAGNOSIS — Z1389 Encounter for screening for other disorder: Secondary | ICD-10-CM | POA: Diagnosis not present

## 2017-12-09 DIAGNOSIS — E782 Mixed hyperlipidemia: Secondary | ICD-10-CM | POA: Diagnosis not present

## 2017-12-09 DIAGNOSIS — Z6825 Body mass index (BMI) 25.0-25.9, adult: Secondary | ICD-10-CM | POA: Diagnosis not present

## 2017-12-09 DIAGNOSIS — R0602 Shortness of breath: Secondary | ICD-10-CM | POA: Diagnosis not present

## 2017-12-09 DIAGNOSIS — J45909 Unspecified asthma, uncomplicated: Secondary | ICD-10-CM | POA: Diagnosis not present

## 2017-12-09 DIAGNOSIS — R0600 Dyspnea, unspecified: Secondary | ICD-10-CM

## 2017-12-09 DIAGNOSIS — E663 Overweight: Secondary | ICD-10-CM | POA: Diagnosis not present

## 2017-12-09 DIAGNOSIS — E7841 Elevated Lipoprotein(a): Secondary | ICD-10-CM | POA: Diagnosis not present

## 2017-12-23 DIAGNOSIS — E291 Testicular hypofunction: Secondary | ICD-10-CM | POA: Diagnosis not present

## 2018-01-12 DIAGNOSIS — E291 Testicular hypofunction: Secondary | ICD-10-CM | POA: Diagnosis not present

## 2018-01-12 DIAGNOSIS — R7989 Other specified abnormal findings of blood chemistry: Secondary | ICD-10-CM | POA: Diagnosis not present

## 2018-01-29 DIAGNOSIS — R7989 Other specified abnormal findings of blood chemistry: Secondary | ICD-10-CM | POA: Diagnosis not present

## 2018-02-02 DIAGNOSIS — T1592XA Foreign body on external eye, part unspecified, left eye, initial encounter: Secondary | ICD-10-CM | POA: Diagnosis not present

## 2018-02-02 DIAGNOSIS — I10 Essential (primary) hypertension: Secondary | ICD-10-CM | POA: Diagnosis not present

## 2018-02-02 DIAGNOSIS — Z87891 Personal history of nicotine dependence: Secondary | ICD-10-CM | POA: Diagnosis not present

## 2018-02-02 DIAGNOSIS — R451 Restlessness and agitation: Secondary | ICD-10-CM | POA: Diagnosis not present

## 2018-02-02 DIAGNOSIS — Z6824 Body mass index (BMI) 24.0-24.9, adult: Secondary | ICD-10-CM | POA: Diagnosis not present

## 2018-02-02 DIAGNOSIS — H5789 Other specified disorders of eye and adnexa: Secondary | ICD-10-CM | POA: Diagnosis not present

## 2018-02-02 DIAGNOSIS — H5712 Ocular pain, left eye: Secondary | ICD-10-CM | POA: Diagnosis not present

## 2018-02-05 DIAGNOSIS — S0502XA Injury of conjunctiva and corneal abrasion without foreign body, left eye, initial encounter: Secondary | ICD-10-CM | POA: Diagnosis not present

## 2018-02-16 DIAGNOSIS — R7989 Other specified abnormal findings of blood chemistry: Secondary | ICD-10-CM | POA: Diagnosis not present

## 2018-03-06 DIAGNOSIS — R7989 Other specified abnormal findings of blood chemistry: Secondary | ICD-10-CM | POA: Diagnosis not present

## 2018-03-23 DIAGNOSIS — R7989 Other specified abnormal findings of blood chemistry: Secondary | ICD-10-CM | POA: Diagnosis not present

## 2018-04-07 DIAGNOSIS — H2513 Age-related nuclear cataract, bilateral: Secondary | ICD-10-CM | POA: Diagnosis not present

## 2018-04-07 DIAGNOSIS — H40023 Open angle with borderline findings, high risk, bilateral: Secondary | ICD-10-CM | POA: Diagnosis not present

## 2018-04-09 DIAGNOSIS — R7989 Other specified abnormal findings of blood chemistry: Secondary | ICD-10-CM | POA: Diagnosis not present

## 2018-04-23 DIAGNOSIS — R7989 Other specified abnormal findings of blood chemistry: Secondary | ICD-10-CM | POA: Diagnosis not present

## 2018-05-07 DIAGNOSIS — R7989 Other specified abnormal findings of blood chemistry: Secondary | ICD-10-CM | POA: Diagnosis not present

## 2018-05-21 DIAGNOSIS — R7989 Other specified abnormal findings of blood chemistry: Secondary | ICD-10-CM | POA: Diagnosis not present

## 2018-06-04 DIAGNOSIS — R7989 Other specified abnormal findings of blood chemistry: Secondary | ICD-10-CM | POA: Diagnosis not present

## 2018-06-23 DIAGNOSIS — Z23 Encounter for immunization: Secondary | ICD-10-CM | POA: Diagnosis not present

## 2018-06-23 DIAGNOSIS — R7989 Other specified abnormal findings of blood chemistry: Secondary | ICD-10-CM | POA: Diagnosis not present

## 2018-07-14 DIAGNOSIS — R7989 Other specified abnormal findings of blood chemistry: Secondary | ICD-10-CM | POA: Diagnosis not present

## 2018-07-14 DIAGNOSIS — E291 Testicular hypofunction: Secondary | ICD-10-CM | POA: Diagnosis not present

## 2018-07-30 DIAGNOSIS — F5101 Primary insomnia: Secondary | ICD-10-CM | POA: Diagnosis not present

## 2018-07-30 DIAGNOSIS — E291 Testicular hypofunction: Secondary | ICD-10-CM | POA: Diagnosis not present

## 2018-07-30 DIAGNOSIS — K219 Gastro-esophageal reflux disease without esophagitis: Secondary | ICD-10-CM | POA: Diagnosis not present

## 2018-07-30 DIAGNOSIS — Z1389 Encounter for screening for other disorder: Secondary | ICD-10-CM | POA: Diagnosis not present

## 2018-07-30 DIAGNOSIS — F419 Anxiety disorder, unspecified: Secondary | ICD-10-CM | POA: Diagnosis not present

## 2018-07-30 DIAGNOSIS — E663 Overweight: Secondary | ICD-10-CM | POA: Diagnosis not present

## 2018-07-30 DIAGNOSIS — Z6825 Body mass index (BMI) 25.0-25.9, adult: Secondary | ICD-10-CM | POA: Diagnosis not present

## 2018-08-13 DIAGNOSIS — R7989 Other specified abnormal findings of blood chemistry: Secondary | ICD-10-CM | POA: Diagnosis not present

## 2018-08-31 DIAGNOSIS — R7989 Other specified abnormal findings of blood chemistry: Secondary | ICD-10-CM | POA: Diagnosis not present

## 2018-09-17 DIAGNOSIS — Z23 Encounter for immunization: Secondary | ICD-10-CM | POA: Diagnosis not present

## 2018-09-17 DIAGNOSIS — R7989 Other specified abnormal findings of blood chemistry: Secondary | ICD-10-CM | POA: Diagnosis not present

## 2018-10-02 DIAGNOSIS — R05 Cough: Secondary | ICD-10-CM | POA: Diagnosis not present

## 2018-10-02 DIAGNOSIS — E063 Autoimmune thyroiditis: Secondary | ICD-10-CM | POA: Diagnosis not present

## 2018-10-02 DIAGNOSIS — I10 Essential (primary) hypertension: Secondary | ICD-10-CM | POA: Diagnosis not present

## 2018-10-02 DIAGNOSIS — Z6824 Body mass index (BMI) 24.0-24.9, adult: Secondary | ICD-10-CM | POA: Diagnosis not present

## 2018-10-02 DIAGNOSIS — Z1389 Encounter for screening for other disorder: Secondary | ICD-10-CM | POA: Diagnosis not present

## 2018-10-02 DIAGNOSIS — F5101 Primary insomnia: Secondary | ICD-10-CM | POA: Diagnosis not present

## 2018-10-02 DIAGNOSIS — R002 Palpitations: Secondary | ICD-10-CM | POA: Diagnosis not present

## 2018-10-02 DIAGNOSIS — R351 Nocturia: Secondary | ICD-10-CM | POA: Diagnosis not present

## 2018-10-02 DIAGNOSIS — Z0001 Encounter for general adult medical examination with abnormal findings: Secondary | ICD-10-CM | POA: Diagnosis not present

## 2018-10-02 DIAGNOSIS — F419 Anxiety disorder, unspecified: Secondary | ICD-10-CM | POA: Diagnosis not present

## 2018-10-02 DIAGNOSIS — J449 Chronic obstructive pulmonary disease, unspecified: Secondary | ICD-10-CM | POA: Diagnosis not present

## 2018-10-02 DIAGNOSIS — E291 Testicular hypofunction: Secondary | ICD-10-CM | POA: Diagnosis not present

## 2018-10-19 DIAGNOSIS — Z1389 Encounter for screening for other disorder: Secondary | ICD-10-CM | POA: Diagnosis not present

## 2018-10-19 DIAGNOSIS — E063 Autoimmune thyroiditis: Secondary | ICD-10-CM | POA: Diagnosis not present

## 2018-10-19 DIAGNOSIS — Z0001 Encounter for general adult medical examination with abnormal findings: Secondary | ICD-10-CM | POA: Diagnosis not present

## 2018-10-19 DIAGNOSIS — Z125 Encounter for screening for malignant neoplasm of prostate: Secondary | ICD-10-CM | POA: Diagnosis not present

## 2018-10-19 DIAGNOSIS — Z6824 Body mass index (BMI) 24.0-24.9, adult: Secondary | ICD-10-CM | POA: Diagnosis not present

## 2018-10-19 DIAGNOSIS — E782 Mixed hyperlipidemia: Secondary | ICD-10-CM | POA: Diagnosis not present

## 2018-10-19 DIAGNOSIS — R7989 Other specified abnormal findings of blood chemistry: Secondary | ICD-10-CM | POA: Diagnosis not present

## 2018-11-02 DIAGNOSIS — E291 Testicular hypofunction: Secondary | ICD-10-CM | POA: Diagnosis not present

## 2018-11-02 DIAGNOSIS — R7989 Other specified abnormal findings of blood chemistry: Secondary | ICD-10-CM | POA: Diagnosis not present

## 2018-11-16 DIAGNOSIS — R7989 Other specified abnormal findings of blood chemistry: Secondary | ICD-10-CM | POA: Diagnosis not present

## 2018-11-16 DIAGNOSIS — E291 Testicular hypofunction: Secondary | ICD-10-CM | POA: Diagnosis not present

## 2018-12-03 DIAGNOSIS — E291 Testicular hypofunction: Secondary | ICD-10-CM | POA: Diagnosis not present

## 2018-12-03 DIAGNOSIS — R7989 Other specified abnormal findings of blood chemistry: Secondary | ICD-10-CM | POA: Diagnosis not present

## 2018-12-18 DIAGNOSIS — R7989 Other specified abnormal findings of blood chemistry: Secondary | ICD-10-CM | POA: Diagnosis not present

## 2018-12-31 DIAGNOSIS — E291 Testicular hypofunction: Secondary | ICD-10-CM | POA: Diagnosis not present

## 2018-12-31 DIAGNOSIS — R7989 Other specified abnormal findings of blood chemistry: Secondary | ICD-10-CM | POA: Diagnosis not present

## 2019-01-19 DIAGNOSIS — R7989 Other specified abnormal findings of blood chemistry: Secondary | ICD-10-CM | POA: Diagnosis not present

## 2019-02-04 ENCOUNTER — Ambulatory Visit (HOSPITAL_COMMUNITY)
Admission: RE | Admit: 2019-02-04 | Discharge: 2019-02-04 | Disposition: A | Discharge status: Home / Self Care | Payer: Medicare Other | Source: Ambulatory Visit | Attending: Internal Medicine | Admitting: Internal Medicine

## 2019-02-04 ENCOUNTER — Other Ambulatory Visit: Payer: Self-pay

## 2019-02-04 ENCOUNTER — Other Ambulatory Visit (HOSPITAL_COMMUNITY): Payer: Self-pay | Admitting: Internal Medicine

## 2019-02-04 DIAGNOSIS — R7989 Other specified abnormal findings of blood chemistry: Secondary | ICD-10-CM | POA: Diagnosis not present

## 2019-02-04 DIAGNOSIS — R05 Cough: Secondary | ICD-10-CM

## 2019-02-04 DIAGNOSIS — R059 Cough, unspecified: Secondary | ICD-10-CM

## 2019-02-18 DIAGNOSIS — R7989 Other specified abnormal findings of blood chemistry: Secondary | ICD-10-CM | POA: Diagnosis not present

## 2019-03-04 DIAGNOSIS — R7989 Other specified abnormal findings of blood chemistry: Secondary | ICD-10-CM | POA: Diagnosis not present

## 2019-03-23 DIAGNOSIS — R7989 Other specified abnormal findings of blood chemistry: Secondary | ICD-10-CM | POA: Diagnosis not present

## 2019-04-08 DIAGNOSIS — R7989 Other specified abnormal findings of blood chemistry: Secondary | ICD-10-CM | POA: Diagnosis not present

## 2019-04-16 DIAGNOSIS — H40023 Open angle with borderline findings, high risk, bilateral: Secondary | ICD-10-CM | POA: Diagnosis not present

## 2019-05-13 ENCOUNTER — Telehealth (INDEPENDENT_AMBULATORY_CARE_PROVIDER_SITE_OTHER): Payer: Self-pay | Admitting: Nurse Practitioner

## 2019-05-13 NOTE — Telephone Encounter (Signed)
Patient left voice mail message stating he needs a refill on his Pantoprazole 40 mg - send to [facility]  -  Patients ph# [phone]

## 2019-05-17 NOTE — Telephone Encounter (Signed)
Mitzie, pls call patient, he was last seen by Dr. Laural Golden 10/2016. I will renew his Pantoprazole for 30 days, pls let pt know. thx

## 2019-05-27 DIAGNOSIS — E291 Testicular hypofunction: Secondary | ICD-10-CM | POA: Diagnosis not present

## 2019-05-27 DIAGNOSIS — R7989 Other specified abnormal findings of blood chemistry: Secondary | ICD-10-CM | POA: Diagnosis not present

## 2019-06-10 DIAGNOSIS — Z23 Encounter for immunization: Secondary | ICD-10-CM | POA: Diagnosis not present

## 2019-06-10 DIAGNOSIS — R7989 Other specified abnormal findings of blood chemistry: Secondary | ICD-10-CM | POA: Diagnosis not present

## 2019-06-24 ENCOUNTER — Encounter (INDEPENDENT_AMBULATORY_CARE_PROVIDER_SITE_OTHER): Payer: Self-pay | Admitting: Nurse Practitioner

## 2019-06-24 ENCOUNTER — Ambulatory Visit (INDEPENDENT_AMBULATORY_CARE_PROVIDER_SITE_OTHER): Payer: Medicare Other | Admitting: Nurse Practitioner

## 2019-06-24 ENCOUNTER — Other Ambulatory Visit: Payer: Self-pay

## 2019-06-24 DIAGNOSIS — K219 Gastro-esophageal reflux disease without esophagitis: Secondary | ICD-10-CM

## 2019-06-24 DIAGNOSIS — R7989 Other specified abnormal findings of blood chemistry: Secondary | ICD-10-CM | POA: Diagnosis not present

## 2019-06-24 MED ORDER — FAMOTIDINE 20 MG PO TABS: 20.0000 mg | ORAL_TABLET | Freq: Every day | ORAL | 1 refills | Status: DC

## 2019-06-24 NOTE — Patient Instructions (Addendum)
1. Continue Pantoprazole 40mg  once capsule by mouth once daily to be taken 30 minutes before lunch or dinner. Famotidine 20mg  one tab at bed time.   2. Reduce Meloxicam and Advil use as these medications can worsen your reflux symptoms. Tylenol 500mg  2 tab by mouth twice daily as needed is ok.   3. Avoid acid foods. Do not eat within 3 hours of going to bed  4. Follow up with your primary care physician regarding red spots to the roof of your mouth  5. Your next  Colonoscopy is due April 2025  6. Follow up in office in 1 year and as needed  Gastroesophageal Reflux Disease, Adult Gastroesophageal reflux (GER) happens when acid from the stomach flows up into the tube that connects the mouth and the stomach (esophagus). Normally, food travels down the esophagus and stays in the stomach to be digested. With GER, food and stomach acid sometimes move back up into the esophagus. You may have a disease called gastroesophageal reflux disease (GERD) if the reflux:  Happens often.  Causes frequent or very bad symptoms.  Causes problems such as damage to the esophagus. When this happens, the esophagus becomes sore and swollen (inflamed). Over time, GERD can make small holes (ulcers) in the lining of the esophagus. What are the causes? This condition is caused by a problem with the muscle between the esophagus and the stomach. When this muscle is weak or not normal, it does not close properly to keep food and acid from coming back up from the stomach. The muscle can be weak because of:  Tobacco use.  Pregnancy.  Having a certain type of hernia (hiatal hernia).  Alcohol use.  Certain foods and drinks, such as coffee, chocolate, onions, and peppermint. What increases the risk? You are more likely to develop this condition if you:  Are overweight.  Have a disease that affects your connective tissue.  Use NSAID medicines. What are the signs or symptoms? Symptoms of this condition  include:  Heartburn.  Difficult or painful swallowing.  The feeling of having a lump in the throat.  A bitter taste in the mouth.  Bad breath.  Having a lot of saliva.  Having an upset or bloated stomach.  Belching.  Chest pain. Different conditions can cause chest pain. Make sure you see your doctor if you have chest pain.  Shortness of breath or noisy breathing (wheezing).  Ongoing (chronic) cough or a cough at night.  Wearing away of the surface of teeth (tooth enamel).  Weight loss. How is this treated? Treatment will depend on how bad your symptoms are. Your doctor may suggest:  Changes to your diet.  Medicine.  Surgery. Follow these instructions at home: Eating and drinking   Follow a diet as told by your doctor. You may need to avoid foods and drinks such as: ? Coffee and tea (with or without caffeine). ? Drinks that contain alcohol. ? Energy drinks and sports drinks. ? Bubbly (carbonated) drinks or sodas. ? Chocolate and cocoa. ? Peppermint and mint flavorings. ? Garlic and onions. ? Horseradish. ? Spicy and acidic foods. These include peppers, chili powder, curry powder, vinegar, hot sauces, and BBQ sauce. ? Citrus fruit juices and citrus fruits, such as oranges, lemons, and limes. ? Tomato-based foods. These include red sauce, chili, salsa, and pizza with red sauce. ? Fried and fatty foods. These include donuts, french fries, potato chips, and high-fat dressings. ? High-fat meats. These include hot dogs, rib eye steak, sausage,  ham, and bacon. ? High-fat dairy items, such as whole milk, butter, and cream cheese.  Eat small meals often. Avoid eating large meals.  Avoid drinking large amounts of liquid with your meals.  Avoid eating meals during the 2-3 hours before bedtime.  Avoid lying down right after you eat.  Do not exercise right after you eat. Lifestyle   Do not use any products that contain nicotine or tobacco. These include  cigarettes, e-cigarettes, and chewing tobacco. If you need help quitting, ask your doctor.  Try to lower your stress. If you need help doing this, ask your doctor.  If you are overweight, lose an amount of weight that is healthy for you. Ask your doctor about a safe weight loss goal. General instructions  Pay attention to any changes in your symptoms.  Take over-the-counter and prescription medicines only as told by your doctor. Do not take aspirin, ibuprofen, or other NSAIDs unless your doctor says it is okay.  Wear loose clothes. Do not wear anything tight around your waist.  Raise (elevate) the head of your bed about 6 inches (15 cm).  Avoid bending over if this makes your symptoms worse.  Keep all follow-up visits as told by your doctor. This is important. Contact a doctor if:  You have new symptoms.  You lose weight and you do not know why.  You have trouble swallowing or it hurts to swallow.  You have wheezing or a cough that keeps happening.  Your symptoms do not get better with treatment.  You have a hoarse voice. Get help right away if:  You have pain in your arms, neck, jaw, teeth, or back.  You feel sweaty, dizzy, or light-headed.  You have chest pain or shortness of breath.  You throw up (vomit) and your throw-up looks like blood or coffee grounds.  You pass out (faint).  Your poop (stool) is bloody or black.  You cannot swallow, drink, or eat. Summary  If a person has gastroesophageal reflux disease (GERD), food and stomach acid move back up into the esophagus and cause symptoms or problems such as damage to the esophagus.  Treatment will depend on how bad your symptoms are.  Follow a diet as told by your doctor.  Take all medicines only as told by your doctor. This information is not intended to replace advice given to you by your health care provider. Make sure you discuss any questions you have with your health care provider. Document Released:  01/29/2008 Document Revised: 02/18/2018 Document Reviewed: 02/18/2018 Elsevier Patient Education  2020 [employer].

## 2019-06-24 NOTE — Progress Notes (Signed)
Subjective:    Patient ID: Zachary Gentry, male    DOB: [birth year], 73 y.o.   MRN: [id number]  HPI Zachary Gentry is a 73 year old male with a past medical history of arthritis, COPD, hypertension, colon polyps and GERD.  He presents today for his annual appointment.  He is taking pantoprazole 40 mg once daily.  He takes aspirin 81 mg daily.  He takes Mobic 15 mg 1 tab as needed, approximately 10 to 20 days monthly.  He also takes Advil 200 mg 4 tabs 3 or 4 times monthly if he has worse shoulder pain.  Complains of having an acid taste in the morning.  No distinct heartburn.  No upper or lower abdominal pain.  He is passing normal formed bowel movement daily.  Or when a colonoscopy 12/07/2016 which identified a normal colon and external hemorrhoids.  He was advised by Dr. Dorien Chihuahua to repeat a colonoscopy in 7 years.  He rides his bicycle a few days weekly and also walks for 1 hour at least 3 days weekly.  No other complaints today.  12/07/2016 Colonoscopy - The entire examined colon is normal. - External hemorrhoids. - No specimens collected. - 7 year recall  Past Medical History:  Diagnosis Date  . Arthritis   . COPD (chronic obstructive pulmonary disease) ([place])   . Hemorrhoids   . History of colon polyps   . Hypertension    Past Surgical History:  Procedure Laterality Date  . BACK SURGERY  '79  . COLONOSCOPY  10/02/2011   Procedure: COLONOSCOPY;  Surgeon: Rogene Houston, MD;  [facility];  Service: Endoscopy;  Laterality: N/A;  930  . COLONOSCOPY N/A 11/06/2016   Procedure: COLONOSCOPY;  Surgeon: Rogene Houston, MD;  Location: [facility];  Service: Endoscopy;  Laterality: N/A;  200  . left knee surgery  '96  . right knee surgery  2007  . SHOULDER SURGERY     Current Outpatient Medications on File Prior to Visit  Medication Sig Dispense Refill  . amoxicillin (AMOXIL) 500 MG capsule Take 500 mg by mouth 3 (three) times daily.    . ARTIFICIAL TEAR OP Apply 1 drop to eye 4  (four) times daily.    . Ascorbic Acid (VITAMIN C) 1000 MG tablet Take 2,000 mg by mouth daily.    Marland Kitchen aspirin EC 81 MG tablet Take 81 mg by mouth daily.    . Cholecalciferol (VITAMIN D3 PO) Take 5,000 Units by mouth daily.    . Coenzyme Q10 (COQ10 PO) Take by mouth daily.    . diazepam (VALIUM) 10 MG tablet Take 5 mg by mouth daily as needed for anxiety. For anxiety    . Fluocinolone Acetonide Scalp 0.01 % OIL Apply 1 application topically once a week.    . fluticasone (FLONASE) 50 MCG/ACT nasal spray Place 1-2 sprays into both nostrils daily as needed for allergies or rhinitis.    Marland Kitchen losartan (COZAAR) 50 MG tablet Take 50 mg by mouth daily.    Marland Kitchen MAGNESIUM PO Take by mouth daily.    . meloxicam (MOBIC) 15 MG tablet Take 15 mg by mouth daily. With food    . Multiple Vitamins-Minerals (EMERGEN-C VITAMIN C) PACK Take 1 Dose by mouth daily as needed (immune support).    . Multiple Vitamins-Minerals (ZINC PO) Take 300 mg by mouth daily.    . Omega-3 Fatty Acids (FISH OIL PO) Take by mouth daily.    . pantoprazole (PROTONIX) 40  MG tablet TAKE ONE TABLET BY MOUTH ONCE DAILY 90 tablet 5  . TESTOSTERONE IM Inject 200 mg into the muscle. Patient rec's this injection every 30 days    . zolpidem (AMBIEN) 10 MG tablet Take 5 mg by mouth at bedtime as needed for sleep.      No current facility-administered medications on file prior to visit.    No Known Allergies  Review of Systems the HPI, all other systems reviewed and are negative    Objective:   Physical Exam BP 129/77   Pulse 60   Temp 98 F (36.7 C) (Oral)   Ht 5\' 10"  (1.778 m)   Wt 170 lb 8 oz (77.3 kg)   BMI 24.46 kg/m  General: 73 year old tall thin male with flushing complexion in no acute distress Eyes: Sclera nonicteric, conjunctiva pink Mouth: Erythematous circular patches to the roof of his mouth, no white coating on tongue, posterior pharynx with mild erythema, no ulcers Neck: Supple, no lymphadenopathy Heart: Regular rate and  rhythm, no murmurs Lungs: Breath sounds clear throughout Abdomen: Flat, soft, nontender, positive bowel sounds all 4 quadrants, no HSM Extremities: No edema Neuro: Alert and oriented x4, no focal deficits     Assessment & Plan:   3.  73 year old male with GERD -Continue pantoprazole 40 mg 1 tab to be taken 30 minutes before lunch or dinner. -Famotidine 20 mg 1 tab at bedtime -GERD handout provided -Decrease Mobic and Advil use, discussed these medications plus aspirin increases his risk for worsening GERD symptoms and ulcers  2.  History of colon polyps -Next colonoscopy due April 2025  3.  Stable oral candidiasis to the roof of his mouth - Patient to follow-up with his primary care physician for further evaluation and treatment

## 2019-07-08 DIAGNOSIS — E291 Testicular hypofunction: Secondary | ICD-10-CM | POA: Diagnosis not present

## 2019-07-15 DIAGNOSIS — K219 Gastro-esophageal reflux disease without esophagitis: Secondary | ICD-10-CM | POA: Diagnosis not present

## 2019-07-15 DIAGNOSIS — G4700 Insomnia, unspecified: Secondary | ICD-10-CM | POA: Diagnosis not present

## 2019-07-15 DIAGNOSIS — Z6824 Body mass index (BMI) 24.0-24.9, adult: Secondary | ICD-10-CM | POA: Diagnosis not present

## 2019-07-15 DIAGNOSIS — I10 Essential (primary) hypertension: Secondary | ICD-10-CM | POA: Diagnosis not present

## 2019-07-29 DIAGNOSIS — R7989 Other specified abnormal findings of blood chemistry: Secondary | ICD-10-CM | POA: Diagnosis not present

## 2019-09-14 DIAGNOSIS — R7989 Other specified abnormal findings of blood chemistry: Secondary | ICD-10-CM | POA: Diagnosis not present

## 2019-09-22 DIAGNOSIS — Z23 Encounter for immunization: Secondary | ICD-10-CM | POA: Diagnosis not present

## 2019-09-24 DIAGNOSIS — M7652 Patellar tendinitis, left knee: Secondary | ICD-10-CM | POA: Diagnosis not present

## 2019-09-24 DIAGNOSIS — M1712 Unilateral primary osteoarthritis, left knee: Secondary | ICD-10-CM | POA: Diagnosis not present

## 2019-10-06 DIAGNOSIS — R7989 Other specified abnormal findings of blood chemistry: Secondary | ICD-10-CM | POA: Diagnosis not present

## 2019-10-12 DIAGNOSIS — E782 Mixed hyperlipidemia: Secondary | ICD-10-CM | POA: Diagnosis not present

## 2019-10-12 DIAGNOSIS — Z6824 Body mass index (BMI) 24.0-24.9, adult: Secondary | ICD-10-CM | POA: Diagnosis not present

## 2019-10-12 DIAGNOSIS — Z125 Encounter for screening for malignant neoplasm of prostate: Secondary | ICD-10-CM | POA: Diagnosis not present

## 2019-10-12 DIAGNOSIS — I10 Essential (primary) hypertension: Secondary | ICD-10-CM | POA: Diagnosis not present

## 2019-10-12 DIAGNOSIS — Z0001 Encounter for general adult medical examination with abnormal findings: Secondary | ICD-10-CM | POA: Diagnosis not present

## 2019-10-12 DIAGNOSIS — R002 Palpitations: Secondary | ICD-10-CM | POA: Diagnosis not present

## 2019-10-12 DIAGNOSIS — J449 Chronic obstructive pulmonary disease, unspecified: Secondary | ICD-10-CM | POA: Diagnosis not present

## 2019-10-12 DIAGNOSIS — Z1389 Encounter for screening for other disorder: Secondary | ICD-10-CM | POA: Diagnosis not present

## 2019-10-12 DIAGNOSIS — K219 Gastro-esophageal reflux disease without esophagitis: Secondary | ICD-10-CM | POA: Diagnosis not present

## 2019-10-12 DIAGNOSIS — E039 Hypothyroidism, unspecified: Secondary | ICD-10-CM | POA: Diagnosis not present

## 2019-10-20 DIAGNOSIS — Z23 Encounter for immunization: Secondary | ICD-10-CM | POA: Diagnosis not present

## 2019-10-26 ENCOUNTER — Other Ambulatory Visit (HOSPITAL_COMMUNITY): Payer: Self-pay | Admitting: Internal Medicine

## 2019-10-26 DIAGNOSIS — R05 Cough: Secondary | ICD-10-CM

## 2019-10-26 DIAGNOSIS — R059 Cough, unspecified: Secondary | ICD-10-CM

## 2019-11-11 DIAGNOSIS — R7989 Other specified abnormal findings of blood chemistry: Secondary | ICD-10-CM | POA: Diagnosis not present

## 2019-11-26 DIAGNOSIS — M25462 Effusion, left knee: Secondary | ICD-10-CM | POA: Diagnosis not present

## 2019-12-07 DIAGNOSIS — M25462 Effusion, left knee: Secondary | ICD-10-CM | POA: Diagnosis not present

## 2019-12-07 DIAGNOSIS — S83232A Complex tear of medial meniscus, current injury, left knee, initial encounter: Secondary | ICD-10-CM | POA: Diagnosis not present

## 2019-12-09 DIAGNOSIS — R7989 Other specified abnormal findings of blood chemistry: Secondary | ICD-10-CM | POA: Diagnosis not present

## 2019-12-16 ENCOUNTER — Other Ambulatory Visit (INDEPENDENT_AMBULATORY_CARE_PROVIDER_SITE_OTHER): Payer: Self-pay | Admitting: Nurse Practitioner

## 2019-12-16 DIAGNOSIS — K219 Gastro-esophageal reflux disease without esophagitis: Secondary | ICD-10-CM

## 2019-12-16 NOTE — Telephone Encounter (Signed)
Hi Tammy, pls have Dr. Shelva Majestic review RX request. Thx.

## 2019-12-22 DIAGNOSIS — M1712 Unilateral primary osteoarthritis, left knee: Secondary | ICD-10-CM | POA: Diagnosis not present

## 2020-01-12 DIAGNOSIS — M25562 Pain in left knee: Secondary | ICD-10-CM | POA: Diagnosis not present

## 2020-01-13 DIAGNOSIS — M1991 Primary osteoarthritis, unspecified site: Secondary | ICD-10-CM | POA: Diagnosis not present

## 2020-01-13 DIAGNOSIS — E291 Testicular hypofunction: Secondary | ICD-10-CM | POA: Diagnosis not present

## 2020-01-13 DIAGNOSIS — E663 Overweight: Secondary | ICD-10-CM | POA: Diagnosis not present

## 2020-01-13 DIAGNOSIS — Z6824 Body mass index (BMI) 24.0-24.9, adult: Secondary | ICD-10-CM | POA: Diagnosis not present

## 2020-01-13 DIAGNOSIS — H9190 Unspecified hearing loss, unspecified ear: Secondary | ICD-10-CM | POA: Diagnosis not present

## 2020-01-31 DIAGNOSIS — Z20822 Contact with and (suspected) exposure to covid-19: Secondary | ICD-10-CM | POA: Diagnosis not present

## 2020-01-31 DIAGNOSIS — M1712 Unilateral primary osteoarthritis, left knee: Secondary | ICD-10-CM | POA: Diagnosis not present

## 2020-01-31 DIAGNOSIS — Z01818 Encounter for other preprocedural examination: Secondary | ICD-10-CM | POA: Diagnosis not present

## 2020-01-31 DIAGNOSIS — Z01812 Encounter for preprocedural laboratory examination: Secondary | ICD-10-CM | POA: Diagnosis not present

## 2020-02-01 DIAGNOSIS — K219 Gastro-esophageal reflux disease without esophagitis: Secondary | ICD-10-CM | POA: Diagnosis not present

## 2020-02-01 DIAGNOSIS — G4733 Obstructive sleep apnea (adult) (pediatric): Secondary | ICD-10-CM | POA: Diagnosis not present

## 2020-02-01 DIAGNOSIS — F419 Anxiety disorder, unspecified: Secondary | ICD-10-CM | POA: Diagnosis not present

## 2020-02-01 DIAGNOSIS — J449 Chronic obstructive pulmonary disease, unspecified: Secondary | ICD-10-CM | POA: Diagnosis not present

## 2020-02-01 DIAGNOSIS — M1712 Unilateral primary osteoarthritis, left knee: Secondary | ICD-10-CM | POA: Diagnosis not present

## 2020-02-01 DIAGNOSIS — I10 Essential (primary) hypertension: Secondary | ICD-10-CM | POA: Diagnosis not present

## 2020-02-04 DIAGNOSIS — M25562 Pain in left knee: Secondary | ICD-10-CM | POA: Diagnosis not present

## 2020-02-04 DIAGNOSIS — Z96652 Presence of left artificial knee joint: Secondary | ICD-10-CM | POA: Diagnosis not present

## 2020-02-04 DIAGNOSIS — R600 Localized edema: Secondary | ICD-10-CM | POA: Diagnosis not present

## 2020-03-06 DIAGNOSIS — R600 Localized edema: Secondary | ICD-10-CM | POA: Diagnosis not present

## 2020-03-06 DIAGNOSIS — M25562 Pain in left knee: Secondary | ICD-10-CM | POA: Diagnosis not present

## 2020-03-06 DIAGNOSIS — Z96652 Presence of left artificial knee joint: Secondary | ICD-10-CM | POA: Diagnosis not present

## 2020-03-09 DIAGNOSIS — Z96652 Presence of left artificial knee joint: Secondary | ICD-10-CM | POA: Diagnosis not present

## 2020-03-09 DIAGNOSIS — M25562 Pain in left knee: Secondary | ICD-10-CM | POA: Diagnosis not present

## 2020-03-09 DIAGNOSIS — R600 Localized edema: Secondary | ICD-10-CM | POA: Diagnosis not present

## 2020-03-13 DIAGNOSIS — R600 Localized edema: Secondary | ICD-10-CM | POA: Diagnosis not present

## 2020-03-13 DIAGNOSIS — M25562 Pain in left knee: Secondary | ICD-10-CM | POA: Diagnosis not present

## 2020-03-13 DIAGNOSIS — Z96652 Presence of left artificial knee joint: Secondary | ICD-10-CM | POA: Diagnosis not present

## 2020-03-17 DIAGNOSIS — R7989 Other specified abnormal findings of blood chemistry: Secondary | ICD-10-CM | POA: Diagnosis not present

## 2020-03-17 DIAGNOSIS — E291 Testicular hypofunction: Secondary | ICD-10-CM | POA: Diagnosis not present

## 2020-03-30 DIAGNOSIS — M25562 Pain in left knee: Secondary | ICD-10-CM | POA: Diagnosis not present

## 2020-03-30 DIAGNOSIS — Z96652 Presence of left artificial knee joint: Secondary | ICD-10-CM | POA: Diagnosis not present

## 2020-03-30 DIAGNOSIS — R600 Localized edema: Secondary | ICD-10-CM | POA: Diagnosis not present

## 2020-04-14 DIAGNOSIS — Z96652 Presence of left artificial knee joint: Secondary | ICD-10-CM | POA: Diagnosis not present

## 2020-04-14 DIAGNOSIS — R600 Localized edema: Secondary | ICD-10-CM | POA: Diagnosis not present

## 2020-04-14 DIAGNOSIS — M25562 Pain in left knee: Secondary | ICD-10-CM | POA: Diagnosis not present

## 2020-04-18 DIAGNOSIS — R7989 Other specified abnormal findings of blood chemistry: Secondary | ICD-10-CM | POA: Diagnosis not present

## 2020-04-26 DIAGNOSIS — Z96652 Presence of left artificial knee joint: Secondary | ICD-10-CM | POA: Diagnosis not present

## 2020-04-26 DIAGNOSIS — M25561 Pain in right knee: Secondary | ICD-10-CM | POA: Diagnosis not present

## 2020-04-27 DIAGNOSIS — M25562 Pain in left knee: Secondary | ICD-10-CM | POA: Diagnosis not present

## 2020-04-27 DIAGNOSIS — Z96652 Presence of left artificial knee joint: Secondary | ICD-10-CM | POA: Diagnosis not present

## 2020-04-27 DIAGNOSIS — R600 Localized edema: Secondary | ICD-10-CM | POA: Diagnosis not present

## 2020-05-02 DIAGNOSIS — I8393 Asymptomatic varicose veins of bilateral lower extremities: Secondary | ICD-10-CM | POA: Diagnosis not present

## 2020-05-10 ENCOUNTER — Other Ambulatory Visit (INDEPENDENT_AMBULATORY_CARE_PROVIDER_SITE_OTHER): Payer: Self-pay | Admitting: Gastroenterology

## 2020-05-10 DIAGNOSIS — K219 Gastro-esophageal reflux disease without esophagitis: Secondary | ICD-10-CM

## 2020-05-10 MED ORDER — PANTOPRAZOLE SODIUM 40 MG PO TBEC: 40.0000 mg | DELAYED_RELEASE_TABLET | Freq: Every day | ORAL | 0 refills | Status: DC

## 2020-05-15 DIAGNOSIS — Z96652 Presence of left artificial knee joint: Secondary | ICD-10-CM | POA: Diagnosis not present

## 2020-05-15 DIAGNOSIS — M25562 Pain in left knee: Secondary | ICD-10-CM | POA: Diagnosis not present

## 2020-05-15 DIAGNOSIS — R600 Localized edema: Secondary | ICD-10-CM | POA: Diagnosis not present

## 2020-05-18 DIAGNOSIS — R7989 Other specified abnormal findings of blood chemistry: Secondary | ICD-10-CM | POA: Diagnosis not present

## 2020-06-01 DIAGNOSIS — M25562 Pain in left knee: Secondary | ICD-10-CM | POA: Diagnosis not present

## 2020-06-01 DIAGNOSIS — Z96652 Presence of left artificial knee joint: Secondary | ICD-10-CM | POA: Diagnosis not present

## 2020-06-01 DIAGNOSIS — R600 Localized edema: Secondary | ICD-10-CM | POA: Diagnosis not present

## 2020-06-06 DIAGNOSIS — H2513 Age-related nuclear cataract, bilateral: Secondary | ICD-10-CM | POA: Diagnosis not present

## 2020-06-06 DIAGNOSIS — H40023 Open angle with borderline findings, high risk, bilateral: Secondary | ICD-10-CM | POA: Diagnosis not present

## 2020-06-12 ENCOUNTER — Other Ambulatory Visit (INDEPENDENT_AMBULATORY_CARE_PROVIDER_SITE_OTHER): Payer: Self-pay | Admitting: *Deleted

## 2020-06-12 ENCOUNTER — Encounter (INDEPENDENT_AMBULATORY_CARE_PROVIDER_SITE_OTHER): Payer: Self-pay | Admitting: Gastroenterology

## 2020-06-12 ENCOUNTER — Ambulatory Visit (INDEPENDENT_AMBULATORY_CARE_PROVIDER_SITE_OTHER): Payer: Medicare Other | Admitting: Gastroenterology

## 2020-06-12 ENCOUNTER — Other Ambulatory Visit: Payer: Self-pay

## 2020-06-12 ENCOUNTER — Encounter (INDEPENDENT_AMBULATORY_CARE_PROVIDER_SITE_OTHER): Payer: Self-pay | Admitting: *Deleted

## 2020-06-12 DIAGNOSIS — Z23 Encounter for immunization: Secondary | ICD-10-CM | POA: Diagnosis not present

## 2020-06-12 DIAGNOSIS — R109 Unspecified abdominal pain: Secondary | ICD-10-CM | POA: Insufficient documentation

## 2020-06-12 DIAGNOSIS — R1012 Left upper quadrant pain: Secondary | ICD-10-CM

## 2020-06-12 DIAGNOSIS — R7989 Other specified abnormal findings of blood chemistry: Secondary | ICD-10-CM | POA: Diagnosis not present

## 2020-06-12 NOTE — Patient Instructions (Addendum)
Schedule EGD  Schedule CT abdomen/pelvis with IV contrast

## 2020-06-12 NOTE — Progress Notes (Signed)
Zachary Gentry, M.D. Gastroenterology & Hepatology [facility] [address] [place], [place] [address]  Primary Care Physician: Redmond School, MD [address] [place] [address]  I will communicate my assessment and recommendations to the referring MD via EMR. "Note: Occasional unusual wording and randomly placed punctuation marks may result from the use of speech recognition technology to transcribe this document"  Problems: 1. Abdominal pain 2. GERD  History of Present Illness: Zachary Gentry is a 74 y.o. old male with a past medical history of arthritis, COPD, hypertension, colon polyps and GERD, who comes to the clinic of migratory abdominal pain.  Patient reports that for the last 4 weeks he has presented intermittent symptoms. He states that after recently eating at a [employer], he initially presented p an episode of burning sensation in his chest, which a few days later it migrated to his epigastric region (then it was described as a "burning sensation").  A few days later he presented some pain in his LUQ described as a dull pain that resolved on its own. He did not notice any specific type of food that triggered his symptoms. He states these symptoms have resolved but he is still presenting frequent burping.  He is concerned as he does not want to miss any diagnosis that could be explaining his symptoms. The patient denies having any nausea, vomiting, fever, chills, hematochezia, melena, hematemesis, abdominal distention, diarrhea, jaundice, pruritus or recent weight loss.  Patient reports he was previously taking morphine for 30 days for knee pain, but he has been off of it. He had a knee operation in 01/2020 and lost significant amount  Has 2 BMs per day as usual. He is eating as usual.  Patient has been taking chronically pantoprazole 40 mg qday and famotidine 40 mg q night.  Last EGD: Never 12/07/2016  Colonoscopy - The entire examined colon is normal. - External hemorrhoids. - No specimens collected. - 7 year recall  FHx: neg for any gastrointestinal/liver disease, mother liver cancer Social: neg smoking, frequent alcohol or illicit drug use Surgical: inguinal hernia  Past Medical History: Past Medical History:  Diagnosis Date  . Arthritis   . COPD (chronic obstructive pulmonary disease) ([place])   . Hemorrhoids   . Hernia, inguinal   . History of colon polyps   . Hypertension     Past Surgical History: Past Surgical History:  Procedure Laterality Date  . BACK SURGERY  '79  . COLONOSCOPY  10/02/2011   Procedure: COLONOSCOPY;  Surgeon: Rogene Houston, MD;  [facility];  Service: Endoscopy;  Laterality: N/A;  930  . COLONOSCOPY N/A 11/06/2016   Procedure: COLONOSCOPY;  Surgeon: Rogene Houston, MD;  [facility]: [facility];  Service: Endoscopy;  Laterality: N/A;  200  . INGUINAL HERNIA REPAIR  2015  . left knee surgery  '96  . right knee surgery  2007  . SHOULDER SURGERY      Family History: Family History  Problem Relation Age of Onset  . Liver cancer Mother   . Healthy Sister   . Healthy Brother     Social History: Social History   Tobacco Use  Smoking Status Never Smoker  Smokeless Tobacco Never Used   Social History   Substance and Sexual Activity  Alcohol Use Yes   Comment: couple of beers approx 5 days per week   Social History   Substance and Sexual Activity  Drug Use No  Allergies: No Known Allergies  Medications: Current Outpatient Medications  Medication Sig Dispense Refill  . amoxicillin (AMOXIL) 500 MG capsule Take 500 mg by mouth 3 (three) times daily.    . ARTIFICIAL TEAR OP Apply 1 drop to eye 4 (four) times daily.    . Ascorbic Acid (VITAMIN C) 1000 MG tablet Take 2,000 mg by mouth daily.    Marland Kitchen aspirin EC 81 MG tablet Take 81 mg by mouth daily.    . Cholecalciferol (VITAMIN D3 PO) Take 5,000 Units by mouth daily.    .  Coenzyme Q10 (COQ10 PO) Take by mouth daily.    . diazepam (VALIUM) 10 MG tablet Take 5 mg by mouth daily as needed for anxiety. For anxiety    . famotidine (PEPCID) 20 MG tablet TAKE 1 TABLET BY MOUTH AT BEDTIME 30 tablet 5  . Fluocinolone Acetonide Scalp 0.01 % OIL Apply 1 application topically once a week.    . fluticasone (FLONASE) 50 MCG/ACT nasal spray Place 1-2 sprays into both nostrils daily as needed for allergies or rhinitis.    Marland Kitchen ketorolac (ACULAR) 0.5 % ophthalmic solution Place 1 drop into the left eye 4 (four) times daily.    Marland Kitchen losartan (COZAAR) 50 MG tablet Take 50 mg by mouth daily.    . meloxicam (MOBIC) 15 MG tablet Take 15 mg by mouth daily. With food    . Multiple Vitamins-Minerals (EMERGEN-C VITAMIN C) PACK Take 1 Dose by mouth daily as needed (immune support).    . Multiple Vitamins-Minerals (ZINC PO) Take 300 mg by mouth daily.    . Omega-3 Fatty Acids (FISH OIL PO) Take by mouth daily.    . pantoprazole (PROTONIX) 40 MG tablet Take 1 tablet (40 mg total) by mouth daily. Due for yearly office visit 90 tablet 0  . prednisoLONE acetate (PRED FORTE) 1 % ophthalmic suspension Place 1 drop into the left eye 4 (four) times daily.    . TESTOSTERONE IM Inject 200 mg into the muscle. Patient rec's this injection every 30 days    . zolpidem (AMBIEN) 10 MG tablet Take 5 mg by mouth at bedtime as needed for sleep.     Marland Kitchen MAGNESIUM PO Take by mouth daily. (Patient not taking: Reported on 06/12/2020)     No current facility-administered medications for this visit.    Review of Systems: GENERAL: negative for malaise, night sweats HEENT: No changes in hearing or vision, no nose bleeds or other nasal problems. NECK: Negative for lumps, goiter, pain and significant neck swelling RESPIRATORY: Negative for cough, wheezing CARDIOVASCULAR: Negative for chest pain, leg swelling, palpitations, orthopnea GI: SEE HPI MUSCULOSKELETAL: Negative for joint pain or swelling, back pain, and muscle  pain. SKIN: Negative for lesions, rash PSYCH: Negative for sleep disturbance, mood disorder and recent psychosocial stressors. HEMATOLOGY Negative for prolonged bleeding, bruising easily, and swollen nodes. ENDOCRINE: Negative for cold or heat intolerance, polyuria, polydipsia and goiter. NEURO: negative for tremor, gait imbalance, syncope and seizures. The remainder of the review of systems is noncontributory.   Physical Exam: BP (!) 148/87 (BP Location: Right Arm, Patient Position: Sitting, Cuff Size: Normal)   Pulse (!) 57   Temp (!) 97.4 F (36.3 C) (Oral)   Ht 5\' 10"  (1.778 m)   Wt 173 lb 9.6 oz (78.7 kg)   BMI 24.91 kg/m  GENERAL: The patient is AO x3, in no acute distress. HEENT: Head is normocephalic and atraumatic. EOMI are intact. Mouth is well hydrated and without lesions. NECK: Supple.  No masses LUNGS: Clear to auscultation. No presence of rhonchi/wheezing/rales. Adequate chest expansion HEART: RRR, normal s1 and s2. ABDOMEN: mild discomfort upon palpation of the epigastric area, no guarding, no peritoneal signs, and nondistended. BS +. No masses. EXTREMITIES: Without any cyanosis, clubbing, rash, lesions or edema. NEUROLOGIC: AOx3, no focal motor deficit. SKIN: no jaundice, no rashes  Imaging/Labs: as above  I personally reviewed and interpreted the available labs, imaging and endoscopic files.  Impression and Plan: Zachary Gentry is a 74 y.o. old male with a past medical history of arthritis, COPD, hypertension, colon polyps and GERD, who comes to the clinic of migratory abdominal pain.  The patient had acute onset of symptoms as described above.  He has not presented any red flag signs but he is concerned about his symptomatology.  It is possible that these symptoms could be related to his exposure to narcotics in the past versus bowel hypersensitivity, however, we will need to rule out an organic etiology given the patient's age.  We will proceed with an EGD and a CT  of the abdomen and pelvis with IV contrast.  Patient understood and agreed.  - Schedule EGD  - Schedule CT abdomen/pelvis with IV contrast   All questions were answered.      Harvel Quale, MD Gastroenterology and Hepatology [facility]

## 2020-06-26 DIAGNOSIS — H2511 Age-related nuclear cataract, right eye: Secondary | ICD-10-CM | POA: Diagnosis not present

## 2020-06-27 ENCOUNTER — Ambulatory Visit (INDEPENDENT_AMBULATORY_CARE_PROVIDER_SITE_OTHER): Payer: Medicare Other | Admitting: Internal Medicine

## 2020-06-29 ENCOUNTER — Ambulatory Visit (INDEPENDENT_AMBULATORY_CARE_PROVIDER_SITE_OTHER): Payer: Medicare Other | Admitting: Gastroenterology

## 2020-07-05 ENCOUNTER — Other Ambulatory Visit: Payer: Self-pay

## 2020-07-05 ENCOUNTER — Ambulatory Visit (HOSPITAL_COMMUNITY)
Admission: RE | Admit: 2020-07-05 | Discharge: 2020-07-05 | Disposition: A | Discharge status: Home / Self Care | Payer: Medicare Other | Source: Ambulatory Visit | Attending: Gastroenterology | Admitting: Gastroenterology

## 2020-07-05 DIAGNOSIS — R1012 Left upper quadrant pain: Secondary | ICD-10-CM | POA: Diagnosis not present

## 2020-07-05 DIAGNOSIS — K5730 Diverticulosis of large intestine without perforation or abscess without bleeding: Secondary | ICD-10-CM | POA: Diagnosis not present

## 2020-07-05 DIAGNOSIS — M47816 Spondylosis without myelopathy or radiculopathy, lumbar region: Secondary | ICD-10-CM | POA: Diagnosis not present

## 2020-07-05 DIAGNOSIS — I77811 Abdominal aortic ectasia: Secondary | ICD-10-CM | POA: Diagnosis not present

## 2020-07-05 DIAGNOSIS — N281 Cyst of kidney, acquired: Secondary | ICD-10-CM | POA: Diagnosis not present

## 2020-07-05 LAB — POCT I-STAT CREATININE: Creatinine, Ser: 1 mg/dL (ref 0.61–1.24)

## 2020-07-05 MED ORDER — IOHEXOL 300 MG/ML  SOLN
100.0000 mL | Freq: Once | INTRAMUSCULAR | Status: AC | PRN
  Administered 2020-07-05: 100 mL via INTRAVENOUS

## 2020-07-11 NOTE — Patient Instructions (Signed)
BENJAMINE STROUT  07/11/2020     @PREFPERIOPPHARMACY @   Your procedure is scheduled on  07/14/2020  Report to [facility] at 1330 (1:30)  P.M.  Call this number if you have problems the morning of surgery:  [phone]   Remember:  Follow the diet instructions given to you by the office.                       Take these medicines the morning of surgery with A SIP OF WATER  Valium( if needed), mobic(if needed), protonix.    Do not wear jewelry, make-up or nail polish.  Do not wear lotions, powders, or perfumes. Please wear deodorant and brush your teeth.  Do not shave 48 hours prior to surgery.  Men may shave face and neck.  Do not bring valuables to the hospital.  [facility] is not responsible for any belongings or valuables.  Contacts, dentures or bridgework may not be worn into surgery.  Leave your suitcase in the car.  After surgery it may be brought to your room.  For patients admitted to the hospital, discharge time will be determined by your treatment team.  Patients discharged the day of surgery will not be allowed to drive home.   Name and phone number of your driver:   family Special instructions:  DO NOT smoke the morning of your procedure.  Please read over the following fact sheets that you were given. Anesthesia Post-op Instructions and Care and Recovery After Surgery       Upper Endoscopy, Adult, Care After This sheet gives you information about how to care for yourself after your procedure. Your health care provider may also give you more specific instructions. If you have problems or questions, contact your health care provider. What can I expect after the procedure? After the procedure, it is common to have:  A sore throat.  Mild stomach pain or discomfort.  Bloating.  Nausea. Follow these instructions at home:   Follow instructions from your health care provider about what to eat or drink after your procedure.  Return to your normal  activities as told by your health care provider. Ask your health care provider what activities are safe for you.  Take over-the-counter and prescription medicines only as told by your health care provider.  Do not drive for 24 hours if you were given a sedative during your procedure.  Keep all follow-up visits as told by your health care provider. This is important. Contact a health care provider if you have:  A sore throat that lasts longer than one day.  Trouble swallowing. Get help right away if:  You vomit blood or your vomit looks like coffee grounds.  You have: ? A fever. ? Bloody, black, or tarry stools. ? A severe sore throat or you cannot swallow. ? Difficulty breathing. ? Severe pain in your chest or abdomen. Summary  After the procedure, it is common to have a sore throat, mild stomach discomfort, bloating, and nausea.  Do not drive for 24 hours if you were given a sedative during the procedure.  Follow instructions from your health care provider about what to eat or drink after your procedure.  Return to your normal activities as told by your health care provider. This information is not intended to replace advice given to you by your health care provider. Make sure you discuss any questions you have with your health care provider. Document  Revised: 02/03/2018 Document Reviewed: 01/12/2018 Elsevier Patient Education  [place] After These instructions provide you with information about caring for yourself after your procedure. Your health care provider may also give you more specific instructions. Your treatment has been planned according to current medical practices, but problems sometimes occur. Call your health care provider if you have any problems or questions after your procedure. What can I expect after the procedure? After your procedure, you may:  Feel sleepy for several hours.  Feel clumsy and have poor balance  for several hours.  Feel forgetful about what happened after the procedure.  Have poor judgment for several hours.  Feel nauseous or vomit.  Have a sore throat if you had a breathing tube during the procedure. Follow these instructions at home: For at least 24 hours after the procedure:      Have a responsible adult stay with you. It is important to have someone help care for you until you are awake and alert.  Rest as needed.  Do not: ? Participate in activities in which you could fall or become injured. ? Drive. ? Use heavy machinery. ? Drink alcohol. ? Take sleeping pills or medicines that cause drowsiness. ? Make important decisions or sign legal documents. ? Take care of children on your own. Eating and drinking  Follow the diet that is recommended by your health care provider.  If you vomit, drink water, juice, or soup when you can drink without vomiting.  Make sure you have little or no nausea before eating solid foods. General instructions  Take over-the-counter and prescription medicines only as told by your health care provider.  If you have sleep apnea, surgery and certain medicines can increase your risk for breathing problems. Follow instructions from your health care provider about wearing your sleep device: ? Anytime you are sleeping, including during daytime naps. ? While taking prescription pain medicines, sleeping medicines, or medicines that make you drowsy.  If you smoke, do not smoke without supervision.  Keep all follow-up visits as told by your health care provider. This is important. Contact a health care provider if:  You keep feeling nauseous or you keep vomiting.  You feel light-headed.  You develop a rash.  You have a fever. Get help right away if:  You have trouble breathing. Summary  For several hours after your procedure, you may feel sleepy and have poor judgment.  Have a responsible adult stay with you for at least 24 hours  or until you are awake and alert. This information is not intended to replace advice given to you by your health care provider. Make sure you discuss any questions you have with your health care provider. Document Revised: 11/10/2017 Document Reviewed: 12/03/2015 Elsevier Patient Education  [place].

## 2020-07-13 ENCOUNTER — Other Ambulatory Visit (HOSPITAL_COMMUNITY)
Admission: RE | Admit: 2020-07-13 | Discharge: 2020-07-13 | Disposition: A | Discharge status: Home / Self Care | Payer: Medicare Other | Source: Ambulatory Visit | Attending: Gastroenterology | Admitting: Gastroenterology

## 2020-07-13 ENCOUNTER — Encounter (HOSPITAL_COMMUNITY): Payer: Self-pay

## 2020-07-13 ENCOUNTER — Other Ambulatory Visit: Payer: Self-pay

## 2020-07-13 ENCOUNTER — Encounter (HOSPITAL_COMMUNITY)
Admission: RE | Admit: 2020-07-13 | Discharge: 2020-07-13 | Disposition: A | Discharge status: Home / Self Care | Payer: Medicare Other | Source: Ambulatory Visit | Attending: Gastroenterology | Admitting: Gastroenterology

## 2020-07-13 DIAGNOSIS — E291 Testicular hypofunction: Secondary | ICD-10-CM | POA: Diagnosis not present

## 2020-07-13 DIAGNOSIS — E063 Autoimmune thyroiditis: Secondary | ICD-10-CM | POA: Diagnosis not present

## 2020-07-13 DIAGNOSIS — Z20822 Contact with and (suspected) exposure to covid-19: Secondary | ICD-10-CM | POA: Insufficient documentation

## 2020-07-13 DIAGNOSIS — E7849 Other hyperlipidemia: Secondary | ICD-10-CM | POA: Diagnosis not present

## 2020-07-13 DIAGNOSIS — I10 Essential (primary) hypertension: Secondary | ICD-10-CM | POA: Diagnosis not present

## 2020-07-13 DIAGNOSIS — J449 Chronic obstructive pulmonary disease, unspecified: Secondary | ICD-10-CM | POA: Diagnosis not present

## 2020-07-13 DIAGNOSIS — Z01812 Encounter for preprocedural laboratory examination: Secondary | ICD-10-CM | POA: Insufficient documentation

## 2020-07-13 DIAGNOSIS — Z6825 Body mass index (BMI) 25.0-25.9, adult: Secondary | ICD-10-CM | POA: Diagnosis not present

## 2020-07-13 HISTORY — DX: Sleep apnea, unspecified: G47.30

## 2020-07-14 ENCOUNTER — Encounter (HOSPITAL_COMMUNITY): Payer: Self-pay | Admitting: Gastroenterology

## 2020-07-14 ENCOUNTER — Ambulatory Visit (HOSPITAL_COMMUNITY): Payer: Medicare Other | Admitting: Anesthesiology

## 2020-07-14 ENCOUNTER — Encounter (HOSPITAL_COMMUNITY)
Admission: RE | Disposition: A | Discharge status: Home / Self Care | Payer: Self-pay | Source: Home / Self Care | Attending: Gastroenterology

## 2020-07-14 ENCOUNTER — Ambulatory Visit (HOSPITAL_COMMUNITY)
Admission: RE | Admit: 2020-07-14 | Discharge: 2020-07-14 | Disposition: A | Discharge status: Home / Self Care | Payer: Medicare Other | Attending: Gastroenterology | Admitting: Gastroenterology

## 2020-07-14 DIAGNOSIS — Z7982 Long term (current) use of aspirin: Secondary | ICD-10-CM | POA: Diagnosis not present

## 2020-07-14 DIAGNOSIS — Z86010 Personal history of colonic polyps: Secondary | ICD-10-CM | POA: Diagnosis not present

## 2020-07-14 DIAGNOSIS — Z791 Long term (current) use of non-steroidal anti-inflammatories (NSAID): Secondary | ICD-10-CM | POA: Diagnosis not present

## 2020-07-14 DIAGNOSIS — M1990 Unspecified osteoarthritis, unspecified site: Secondary | ICD-10-CM | POA: Insufficient documentation

## 2020-07-14 DIAGNOSIS — Z79899 Other long term (current) drug therapy: Secondary | ICD-10-CM | POA: Insufficient documentation

## 2020-07-14 DIAGNOSIS — I10 Essential (primary) hypertension: Secondary | ICD-10-CM | POA: Diagnosis not present

## 2020-07-14 DIAGNOSIS — J449 Chronic obstructive pulmonary disease, unspecified: Secondary | ICD-10-CM | POA: Insufficient documentation

## 2020-07-14 DIAGNOSIS — K219 Gastro-esophageal reflux disease without esophagitis: Secondary | ICD-10-CM | POA: Diagnosis not present

## 2020-07-14 DIAGNOSIS — K317 Polyp of stomach and duodenum: Secondary | ICD-10-CM | POA: Diagnosis not present

## 2020-07-14 DIAGNOSIS — R1012 Left upper quadrant pain: Secondary | ICD-10-CM | POA: Diagnosis not present

## 2020-07-14 HISTORY — PX: BIOPSY: SHX5522

## 2020-07-14 HISTORY — PX: ESOPHAGOGASTRODUODENOSCOPY (EGD) WITH PROPOFOL: SHX5813

## 2020-07-14 LAB — SARS CORONAVIRUS 2 (TAT 6-24 HRS): SARS Coronavirus 2: NEGATIVE

## 2020-07-14 SURGERY — ESOPHAGOGASTRODUODENOSCOPY (EGD) WITH PROPOFOL
Anesthesia: General

## 2020-07-14 MED ORDER — LACTATED RINGERS IV SOLN: INTRAVENOUS | Status: DC | PRN

## 2020-07-14 MED ORDER — PROPOFOL 500 MG/50ML IV EMUL: INTRAVENOUS | Status: DC | PRN

## 2020-07-14 MED ORDER — LIDOCAINE HCL (CARDIAC) PF 100 MG/5ML IV SOSY
PREFILLED_SYRINGE | INTRAVENOUS | Status: DC | PRN
  Administered 2020-07-14: 50 mg via INTRAVENOUS

## 2020-07-14 MED ORDER — PROPOFOL 10 MG/ML IV BOLUS
INTRAVENOUS | Status: DC | PRN
  Administered 2020-07-14: 100 mg via INTRAVENOUS

## 2020-07-14 MED ORDER — LACTATED RINGERS IV SOLN: Freq: Once | INTRAVENOUS | Status: AC

## 2020-07-14 MED ORDER — PROPOFOL 500 MG/50ML IV EMUL
INTRAVENOUS | Status: DC | PRN
  Administered 2020-07-14: 125 ug/kg/min via INTRAVENOUS

## 2020-07-14 MED ORDER — PROPOFOL 10 MG/ML IV BOLUS: INTRAVENOUS | Status: DC | PRN

## 2020-07-14 MED ORDER — GLYCOPYRROLATE 0.2 MG/ML IJ SOLN
0.2000 mg | Freq: Once | INTRAMUSCULAR | Status: AC
  Administered 2020-07-14: 0.2 mg via INTRAVENOUS

## 2020-07-14 MED ORDER — STERILE WATER FOR IRRIGATION IR SOLN
Status: DC | PRN
  Administered 2020-07-14: 100 mL

## 2020-07-14 MED ORDER — LIDOCAINE VISCOUS HCL 2 % MT SOLN
15.0000 mL | Freq: Once | OROMUCOSAL | Status: AC
  Administered 2020-07-14: 15 mL via OROMUCOSAL

## 2020-07-14 NOTE — Anesthesia Preprocedure Evaluation (Signed)
Anesthesia Evaluation  Patient identified by MRN, date of birth, ID band Patient awake    Reviewed: Allergy & Precautions, NPO status , Patient's Chart, lab work & pertinent test results  Airway Mallampati: II  TM Distance: >3 FB Neck ROM: Full    Dental  (+) Dental Advisory Given, Caps   Pulmonary sleep apnea (noncomplaint with CPAP) , COPD,    Pulmonary exam normal breath sounds clear to auscultation       Cardiovascular Exercise Tolerance: Good hypertension, Pt. on medications Normal cardiovascular exam Rhythm:Regular Rate:Normal     Neuro/Psych negative neurological ROS  negative psych ROS   GI/Hepatic GERD  Medicated,(+)     substance abuse  alcohol use,   Endo/Other  negative endocrine ROS  Renal/GU negative Renal ROS     Musculoskeletal  (+) Arthritis  (chronic low back pain),   Abdominal   Peds  Hematology negative hematology ROS (+)   Anesthesia Other Findings   Reproductive/Obstetrics                            Anesthesia Physical Anesthesia Plan  ASA: II  Anesthesia Plan: General   Post-op Pain Management:    Induction: Intravenous  PONV Risk Score and Plan: TIVA  Airway Management Planned: Nasal Cannula and Natural Airway  Additional Equipment:   Intra-op Plan:   Post-operative Plan:   Informed Consent: I have reviewed the patients History and Physical, chart, labs and discussed the procedure including the risks, benefits and alternatives for the proposed anesthesia with the patient or authorized representative who has indicated his/her understanding and acceptance.     Dental advisory given  Plan Discussed with: CRNA and Surgeon  Anesthesia Plan Comments:         Anesthesia Quick Evaluation

## 2020-07-14 NOTE — Discharge Instructions (Signed)
You are being discharged to home.  Resume your previous diet.  We are waiting for your pathology results.       Upper Endoscopy, Adult, Care After This sheet gives you information about how to care for yourself after your procedure. Your health care provider may also give you more specific instructions. If you have problems or questions, contact your health care provider. What can I expect after the procedure? After the procedure, it is common to have:  A sore throat.  Mild stomach pain or discomfort.  Bloating.  Nausea. Follow these instructions at home:   Follow instructions from your health care provider about what to eat or drink after your procedure.  Return to your normal activities as told by your health care provider. Ask your health care provider what activities are safe for you.  Take over-the-counter and prescription medicines only as told by your health care provider.  Do not drive for 24 hours if you were given a sedative during your procedure.  Keep all follow-up visits as told by your health care provider. This is important. Contact a health care provider if you have:  A sore throat that lasts longer than one day.  Trouble swallowing. Get help right away if:  You vomit blood or your vomit looks like coffee grounds.  You have: ? A fever. ? Bloody, black, or tarry stools. ? A severe sore throat or you cannot swallow. ? Difficulty breathing. ? Severe pain in your chest or abdomen. Summary  After the procedure, it is common to have a sore throat, mild stomach discomfort, bloating, and nausea.  Do not drive for 24 hours if you were given a sedative during the procedure.  Follow instructions from your health care provider about what to eat or drink after your procedure.  Return to your normal activities as told by your health care provider. This information is not intended to replace advice given to you by your health care provider. Make sure you  discuss any questions you have with your health care provider. Document Revised: 02/03/2018 Document Reviewed: 01/12/2018 Elsevier Patient Education  2020 [place] After These instructions provide you with information about caring for yourself after your procedure. Your health care provider may also give you more specific instructions. Your treatment has been planned according to current medical practices, but problems sometimes occur. Call your health care provider if you have any problems or questions after your procedure. What can I expect after the procedure? After your procedure, you may:  Feel sleepy for several hours.  Feel clumsy and have poor balance for several hours.  Feel forgetful about what happened after the procedure.  Have poor judgment for several hours.  Feel nauseous or vomit.  Have a sore throat if you had a breathing tube during the procedure. Follow these instructions at home: For at least 24 hours after the procedure:      Have a responsible adult stay with you. It is important to have someone help care for you until you are awake and alert.  Rest as needed.  Do not: ? Participate in activities in which you could fall or become injured. ? Drive. ? Use heavy machinery. ? Drink alcohol. ? Take sleeping pills or medicines that cause drowsiness. ? Make important decisions or sign legal documents. ? Take care of children on your own. Eating and drinking  Follow the diet that is recommended by your health care provider.  If you  vomit, drink water, juice, or soup when you can drink without vomiting.  Make sure you have little or no nausea before eating solid foods. General instructions  Take over-the-counter and prescription medicines only as told by your health care provider.  If you have sleep apnea, surgery and certain medicines can increase your risk for breathing problems. Follow instructions from your  health care provider about wearing your sleep device: ? Anytime you are sleeping, including during daytime naps. ? While taking prescription pain medicines, sleeping medicines, or medicines that make you drowsy.  If you smoke, do not smoke without supervision.  Keep all follow-up visits as told by your health care provider. This is important. Contact a health care provider if:  You keep feeling nauseous or you keep vomiting.  You feel light-headed.  You develop a rash.  You have a fever. Get help right away if:  You have trouble breathing. Summary  For several hours after your procedure, you may feel sleepy and have poor judgment.  Have a responsible adult stay with you for at least 24 hours or until you are awake and alert. This information is not intended to replace advice given to you by your health care provider. Make sure you discuss any questions you have with your health care provider. Document Revised: 11/10/2017 Document Reviewed: 12/03/2015 Elsevier Patient Education  [place].

## 2020-07-14 NOTE — Transfer of Care (Signed)
Immediate Anesthesia Transfer of Care Note  Patient: Zachary Gentry  Procedure(s) Performed: ESOPHAGOGASTRODUODENOSCOPY (EGD) WITH PROPOFOL (N/A ) BIOPSY  Patient Location: PACU  Anesthesia Type:General  Level of Consciousness: awake, alert  and patient cooperative  Airway & Oxygen Therapy: Patient Spontanous Breathing and Patient connected to nasal cannula oxygen  Post-op Assessment: Report given to RN and Post -op Vital signs reviewed and stable  Post vital signs: Reviewed and stable  Last Vitals:  Vitals Value Taken Time  BP 87/49 07/14/20 1548  Temp    Pulse 59 07/14/20 1549  Resp 16 07/14/20 1549  SpO2 97 % 07/14/20 1549  Vitals shown include unvalidated device data.  Last Pain:  Vitals:   07/14/20 1524  TempSrc:   PainSc: 0-No pain         Complications: No complications documented.

## 2020-07-14 NOTE — Op Note (Signed)
[facility] Patient Name: Zachary Gentry Procedure Date: 07/14/2020 2:57 PM MRN: [id number] Date of Birth: 09-10-1945 Attending MD: Maylon Peppers ,  CSN: [id number] Age: 74 Admit Type: Outpatient Procedure:                Upper GI endoscopy Indications:              Abdominal pain in the left upper quadrant Providers:                Maylon Peppers, Janeece Riggers, RN, Nelma Rothman,                            Technician, Kristine L. Risa Grill, Technician Referring MD:              Medicines:                Monitored Anesthesia Care Complications:            No immediate complications. Estimated Blood Loss:     Estimated blood loss: none. Procedure:                Pre-Anesthesia Assessment:                           - Prior to the procedure, a History and Physical                            was performed, and patient medications, allergies                            and sensitivities were reviewed. The patient's                            tolerance of previous anesthesia was reviewed.                           - The risks and benefits of the procedure and the                            sedation options and risks were discussed with the                            patient. All questions were answered and informed                            consent was obtained.                           - ASA Grade Assessment: II - A patient with mild                            systemic disease.                           After obtaining informed consent, the endoscope was                            passed  under direct vision. Throughout the                            procedure, the patient's blood pressure, pulse, and                            oxygen saturations were monitored continuously. The                            [id number]-[id number] ([id number]) scope was introduced through the                            mouth, and advanced to the second part of duodenum.                            The upper GI endoscopy  was accomplished without                            difficulty. The patient tolerated the procedure                            well. Scope In: 3:33:50 PM Scope Out: 3:38:51 PM Total Procedure Duration: 0 hours 5 minutes 1 second  Findings:      The examined esophagus was normal.      A few 2 to 3 mm sessile polyps with no bleeding were found in the       gastric fundus. Biopsies were taken with a cold forceps for histology.      The examined duodenum was normal. Impression:               - Normal esophagus.                           - A few gastric polyps. Biopsied.                           - Normal examined duodenum. Moderate Sedation:      Per Anesthesia Care Recommendation:           - Discharge patient to home (ambulatory).                           - Resume previous diet.                           - Await pathology results. Procedure Code(s):        --- Professional ---                           [phone], GC, Esophagogastroduodenoscopy, flexible,                            transoral; with biopsy, single or multiple Diagnosis Code(s):        --- Professional ---                           K31.7, Polyp of stomach and duodenum  R10.12, Left upper quadrant pain CPT copyright 2019 American Medical Association. All rights reserved. The codes documented in this report are preliminary and upon coder review may  be revised to meet current compliance requirements. Maylon Peppers, MD Maylon Peppers,  07/14/2020 3:41:34 PM This report has been signed electronically. Number of Addenda: 0

## 2020-07-14 NOTE — H&P (Signed)
Zachary Gentry is an 74 y.o. male.   Chief Complaint: Abdominal pain HPI: Zachary Gentry is a 74 y.o. old malewith a past medical history of arthritis, COPD, hypertension, colon polyps and GERD, who comes to the hospital for evaluation abdominal pain.  Patient reports feeling well.  States that his abdominal pain has completely resolved but has presented some episodes of hiccups, denies having any nausea, vomiting, fever, chills, abdominal distention or weight loss.  He underwent a CT abdomen and pelvis with IV contrast recently that was negative for any intra-abdominal pathology to explain his pain.    Patient had previously presented abdominal pain in mid September that was migratory after eating at a [employer].  This has currently resolved.  Past Medical History:  Diagnosis Date  . Arthritis   . COPD (chronic obstructive pulmonary disease) ([place])   . Hemorrhoids   . Hernia, inguinal   . History of colon polyps   . Hypertension   . Sleep apnea     Past Surgical History:  Procedure Laterality Date  . BACK SURGERY  '79  . COLONOSCOPY  10/02/2011   Procedure: COLONOSCOPY;  Surgeon: Rogene Houston, MD;  Location: [facility];  Service: Endoscopy;  Laterality: N/A;  930  . COLONOSCOPY N/A 11/06/2016   Procedure: COLONOSCOPY;  Surgeon: Rogene Houston, MD;  Location: [facility]ITE;  Service: Endoscopy;  Laterality: N/A;  200  . INGUINAL HERNIA REPAIR  2015  . left knee surgery  '96  . right knee surgery  2007  . SHOULDER SURGERY      Family History  Problem Relation Age of Onset  . Liver cancer Mother   . Healthy Sister   . Healthy Brother    Social History:  reports that he has never smoked. He has never used smokeless tobacco. He reports current alcohol use. He reports that he does not use drugs.  Allergies:  Allergies  Allergen Reactions  . Hydrocodone-Acetaminophen Itching    Medications Prior to Admission  Medication Sig Dispense Refill  . amoxicillin  (AMOXIL) 500 MG capsule Take 500 mg by mouth See admin instructions. Take 2000 mg by mouth one hour prior to dental procedure    . ARTIFICIAL TEAR OP Place 1 drop into both eyes 3 (three) times daily as needed (dry eyes).     . Ascorbic Acid (VITAMIN C) 1000 MG tablet Take 1,000 mg by mouth daily.     Marland Kitchen aspirin EC 81 MG tablet Take 81 mg by mouth daily.    . Cholecalciferol (VITAMIN D3) 125 MCG (5000 UT) TABS Take 5,000 Units by mouth daily.     . Coenzyme Q10 (COQ10 PO) Take 1 capsule by mouth daily with supper.     . diazepam (VALIUM) 10 MG tablet Take 5 mg by mouth daily as needed for anxiety.     . famotidine (PEPCID) 20 MG tablet TAKE 1 TABLET BY MOUTH AT BEDTIME (Patient taking differently: Take 20 mg by mouth at bedtime. ) 30 tablet 5  . Fluocinolone Acetonide Scalp 0.01 % OIL Apply 1 application topically once a week.    . losartan (COZAAR) 50 MG tablet Take 50 mg by mouth daily.    . meloxicam (MOBIC) 15 MG tablet Take 15 mg by mouth daily. With food    . milk thistle 175 MG tablet Take 350 mg by mouth daily.    . Multiple Vitamins-Minerals (EMERGEN-C VITAMIN C) PACK Take 1 Package by mouth 3 (three) times a  week.     . Omega-3 Fatty Acids (FISH OIL PO) Take 1 capsule by mouth daily.     . pantoprazole (PROTONIX) 40 MG tablet Take 1 tablet (40 mg total) by mouth daily. Due for yearly office visit (Patient taking differently: Take 40 mg by mouth daily. ) 90 tablet 0  . TESTOSTERONE IM Inject 200 mg into the muscle every 30 (thirty) days.     Marland Kitchen zolpidem (AMBIEN) 10 MG tablet Take 5 mg by mouth at bedtime as needed for sleep.     . fluticasone (FLONASE) 50 MCG/ACT nasal spray Place 1-2 sprays into both nostrils daily as needed for allergies or rhinitis. (Patient not taking: Reported on 07/07/2020)    . ketorolac (ACULAR) 0.5 % ophthalmic solution Place 1 drop into the left eye 4 (four) times daily. (Patient not taking: Reported on 07/07/2020)    . MAGNESIUM PO Take by mouth daily. (Patient  not taking: Reported on 06/12/2020)    . Multiple Vitamins-Minerals (ZINC PO) Take 300 mg by mouth daily. (Patient not taking: Reported on 07/07/2020)    . prednisoLONE acetate (PRED FORTE) 1 % ophthalmic suspension Place 1 drop into the left eye 4 (four) times daily. (Patient not taking: Reported on 07/07/2020)      Results for orders placed or performed during the hospital encounter of 07/13/20 (from the past 48 hour(s))  SARS CORONAVIRUS 2 (TAT 6-24 HRS) Nasopharyngeal Nasopharyngeal Swab     Status: None   Collection Time: 07/13/20 10:27 AM   Specimen: Nasopharyngeal Swab  Result Value Ref Range   SARS Coronavirus 2 NEGATIVE NEGATIVE    Comment: (NOTE) SARS-CoV-2 target nucleic acids are NOT DETECTED.  The SARS-CoV-2 RNA is generally detectable in upper and lower respiratory specimens during the acute phase of infection. Negative results do not preclude SARS-CoV-2 infection, do not rule out co-infections with other pathogens, and should not be used as the sole basis for treatment or other patient management decisions. Negative results must be combined with clinical observations, patient history, and epidemiological information. The expected result is Negative.  Fact Sheet for Patients: SugarRoll.be  Fact Sheet for Healthcare Providers: https://www.woods-mathews.com/  This test is not yet approved or cleared by the [place] FDA and  has been authorized for detection and/or diagnosis of SARS-CoV-2 by FDA under an Emergency Use Authorization (EUA). This EUA will remain  in effect (meaning this test can be used) for the duration of the COVID-19 declaration under Se ction 564(b)(1) of the Act, 21 U.S.C. section 360bbb-3(b)(1), unless the authorization is terminated or revoked sooner.  Performed at [facility], [place] [address] [place], [place] [address]    No results found.  Review of Systems  Constitutional: Negative.    HENT: Negative.   Eyes: Negative.   Respiratory: Negative.   Cardiovascular: Negative.   Gastrointestinal: Negative.   Endocrine: Negative.   Genitourinary: Negative.   Musculoskeletal: Negative.   Skin: Negative.   Allergic/Immunologic: Negative.   Hematological: Negative.   Psychiatric/Behavioral: Negative.     Blood pressure (!) 143/82, pulse (!) 57, temperature 97.7 F (36.5 C), temperature source Oral, resp. rate 17, height 5\' 10"  (1.778 m), weight 81.6 kg, SpO2 100 %. Physical Exam  GENERAL: The patient is AO x3, in no acute distress. HEENT: Head is normocephalic and atraumatic. EOMI are intact. Mouth is well hydrated and without lesions. NECK: Supple. No masses LUNGS: Clear to auscultation. No presence of rhonchi/wheezing/rales. Adequate chest expansion HEART: RRR, normal s1 and s2. ABDOMEN:  Soft, nontender, no guarding, no peritoneal signs, and nondistended. BS +. No masses. EXTREMITIES: Without any cyanosis, clubbing, rash, lesions or edema. NEUROLOGIC: AOx3, no focal motor deficit. SKIN: no jaundice, no rashes Assessment/Plan Zachary Gentry is a 74 y.o. old malewith a past medical history of arthritis, COPD, hypertension, colon polyps and GERD, who comes to the hospital for evaluation abdominal pain. Will perform EGD.  Harvel Quale, MD 07/14/2020, 3:29 PM

## 2020-07-14 NOTE — Anesthesia Postprocedure Evaluation (Signed)
Anesthesia Post Note  Patient: Zachary Gentry  Procedure(s) Performed: ESOPHAGOGASTRODUODENOSCOPY (EGD) WITH PROPOFOL (N/A ) BIOPSY  Patient location during evaluation: PACU Anesthesia Type: General Level of consciousness: awake and alert Pain management: pain level controlled Vital Signs Assessment: post-procedure vital signs reviewed and stable Respiratory status: spontaneous breathing Cardiovascular status: stable Postop Assessment: no apparent nausea or vomiting Anesthetic complications: no   No complications documented.   Last Vitals:  Vitals:   07/14/20 1348 07/14/20 1547  BP: (!) 143/82 (!) (P) 87/49  Pulse: (!) 57 (!) 56  Resp: 17 12  Temp: 36.5 C (P) 36.5 C  SpO2: 100% 98%    Last Pain:  Vitals:   07/14/20 1524  TempSrc:   PainSc: 0-No pain                 Everette Rank

## 2020-07-15 MED ORDER — GLYCOPYRROLATE 0.2 MG/ML IJ SOLN
INTRAMUSCULAR | Status: AC
  Filled 2020-07-15: qty 1

## 2020-07-18 LAB — SURGICAL PATHOLOGY

## 2020-07-19 ENCOUNTER — Encounter (HOSPITAL_COMMUNITY): Payer: Self-pay | Admitting: Gastroenterology

## 2020-07-28 DIAGNOSIS — Z96652 Presence of left artificial knee joint: Secondary | ICD-10-CM | POA: Diagnosis not present

## 2020-08-08 DIAGNOSIS — R7989 Other specified abnormal findings of blood chemistry: Secondary | ICD-10-CM | POA: Diagnosis not present

## 2020-08-23 ENCOUNTER — Other Ambulatory Visit (INDEPENDENT_AMBULATORY_CARE_PROVIDER_SITE_OTHER): Payer: Self-pay

## 2020-08-23 DIAGNOSIS — K219 Gastro-esophageal reflux disease without esophagitis: Secondary | ICD-10-CM

## 2020-08-23 MED ORDER — PANTOPRAZOLE SODIUM 40 MG PO TBEC: 40.0000 mg | DELAYED_RELEASE_TABLET | Freq: Every day | ORAL | 3 refills | Status: DC

## 2020-11-29 ENCOUNTER — Other Ambulatory Visit: Payer: Self-pay

## 2020-11-29 ENCOUNTER — Ambulatory Visit (HOSPITAL_COMMUNITY)
Admission: RE | Admit: 2020-11-29 | Discharge: 2020-11-29 | Disposition: A | Discharge status: Home / Self Care | Payer: Medicare Other | Source: Ambulatory Visit | Attending: Physician Assistant | Admitting: Physician Assistant

## 2020-11-29 ENCOUNTER — Other Ambulatory Visit (HOSPITAL_COMMUNITY): Payer: Self-pay | Admitting: Physician Assistant

## 2020-11-29 DIAGNOSIS — J449 Chronic obstructive pulmonary disease, unspecified: Secondary | ICD-10-CM

## 2021-01-07 ENCOUNTER — Other Ambulatory Visit (INDEPENDENT_AMBULATORY_CARE_PROVIDER_SITE_OTHER): Payer: Self-pay | Admitting: Internal Medicine

## 2021-01-07 DIAGNOSIS — K219 Gastro-esophageal reflux disease without esophagitis: Secondary | ICD-10-CM

## 2021-01-26 IMAGING — CT CT ABD-PELV W/ CM
2 of 5 series · 16 of 46 positions shown, 18 images · IV contrast (Omnipaque or Isovue)
Comparison: 05/28/2012

CLINICAL DATA: Left upper quadrant pain for several weeks

EXAM:
CT ABDOMEN AND PELVIS WITH CONTRAST
TECHNIQUE: Multidetector CT imaging of the abdomen and pelvis was performed
using the standard protocol following bolus administration of
intravenous contrast.
CONTRAST:  100mL OMNIPAQUE IOHEXOL 300 MG/ML  SOLN

[Series 2: axial st · axial · 0.75mm/px · z∈[-806,-396]mm · 13 of 94 slices shown, 15 images]
[im 6/94  soft-tissue]
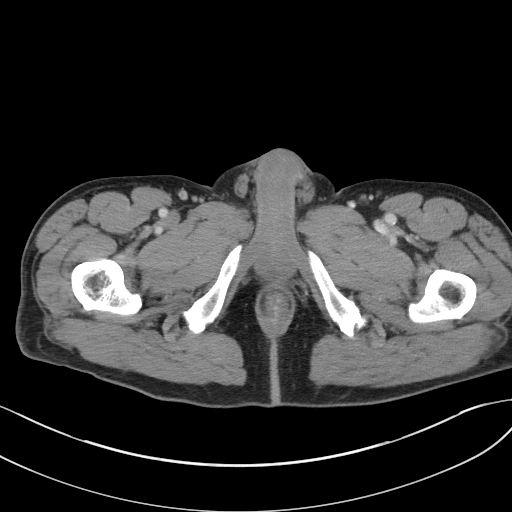
[im 6/94  bone]
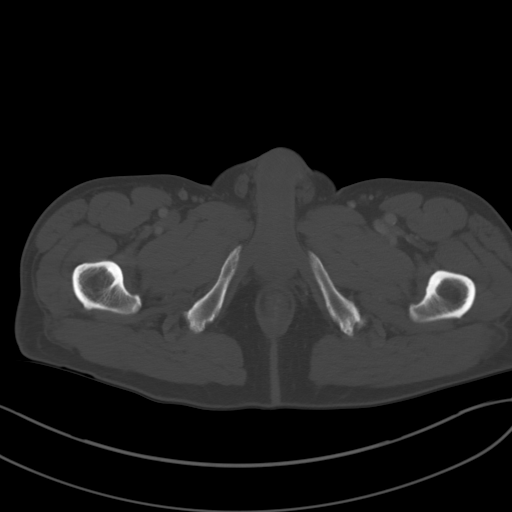
[im 11/94  soft-tissue]
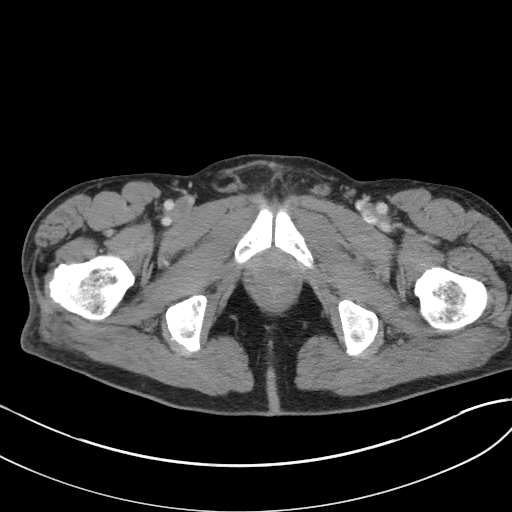
[im 21/94  soft-tissue]
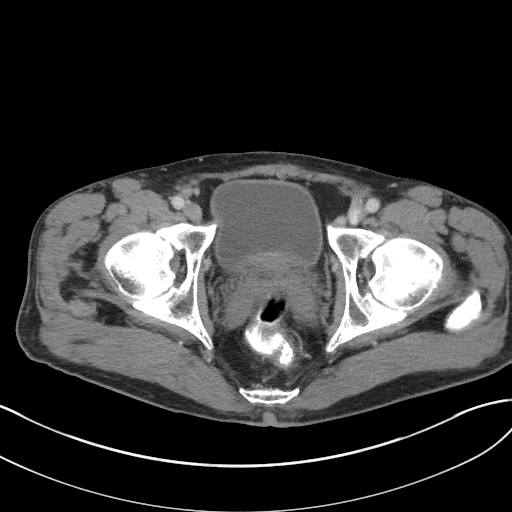
[im 26/94  soft-tissue]
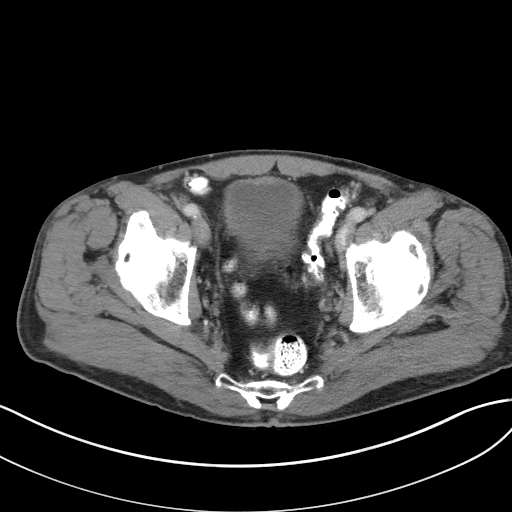
[im 32/94  soft-tissue]
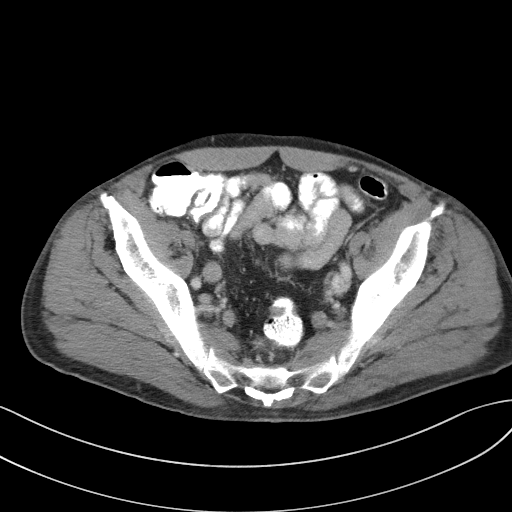
[im 42/94  soft-tissue]
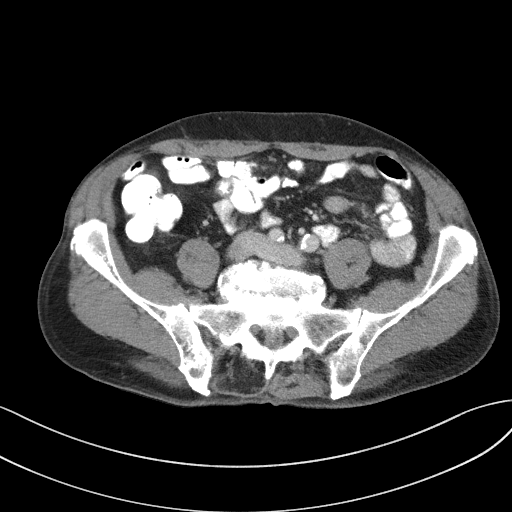
[im 47/94  soft-tissue]
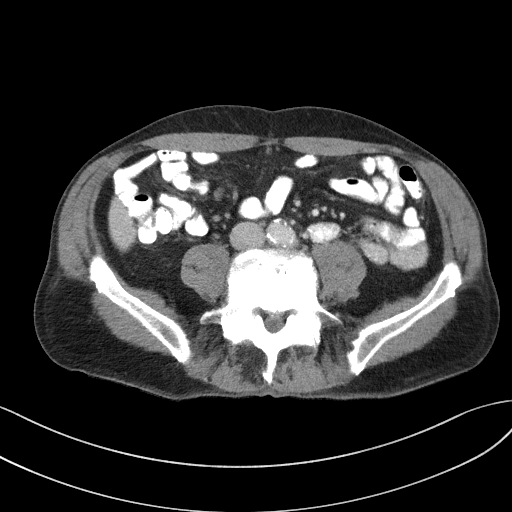
[im 52/94  soft-tissue]
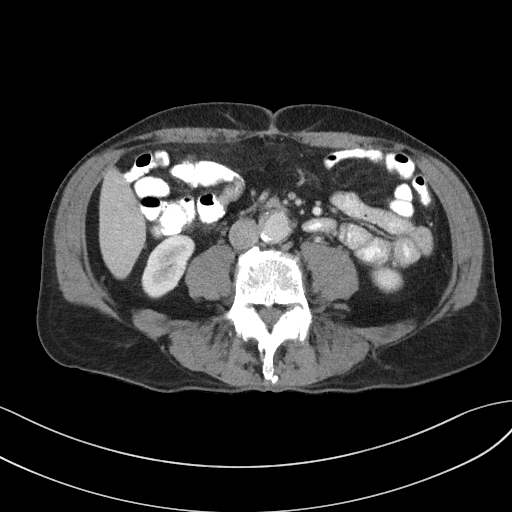
[im 63/94  soft-tissue]
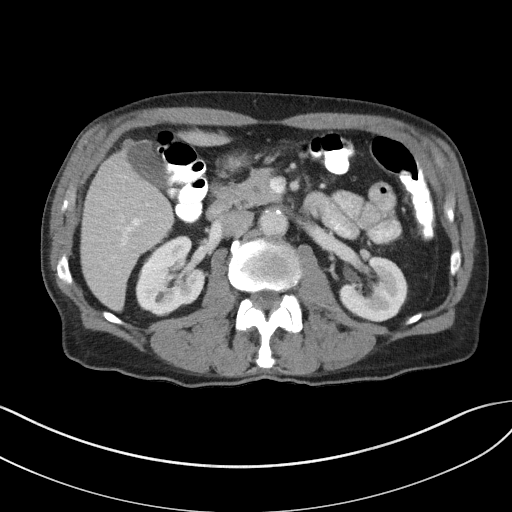
[im 63/94  bone]
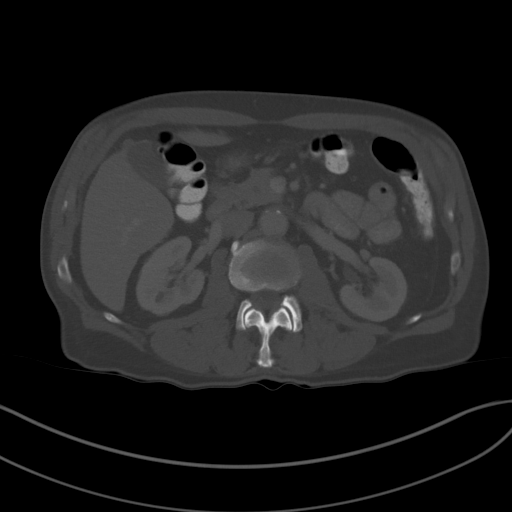
[im 68/94  soft-tissue]
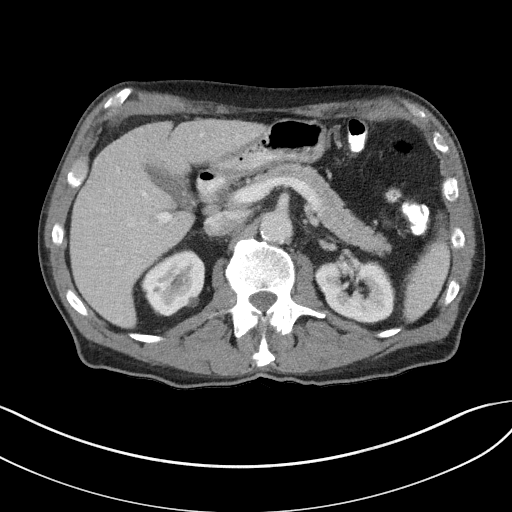
[im 73/94  soft-tissue]
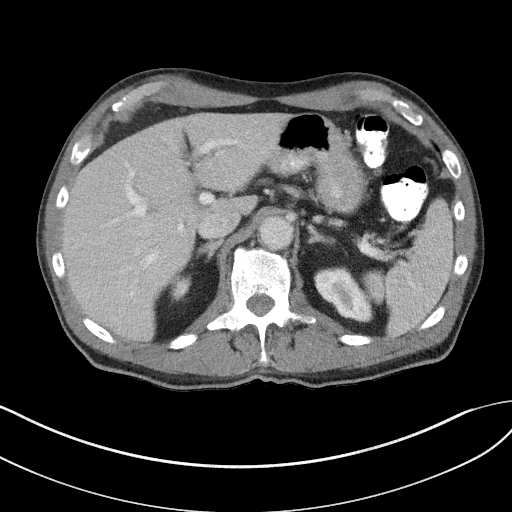
[im 83/94  soft-tissue]
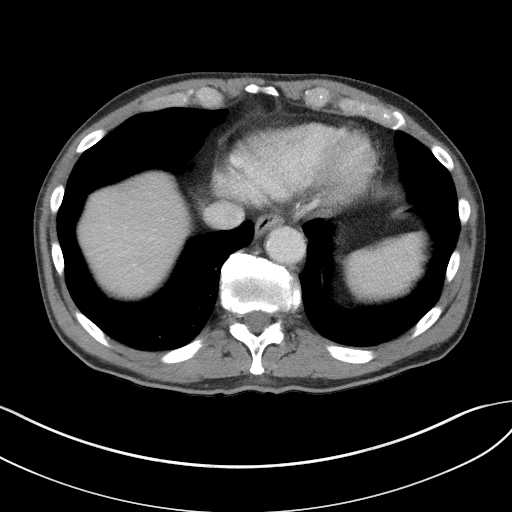
[im 88/94  soft-tissue]
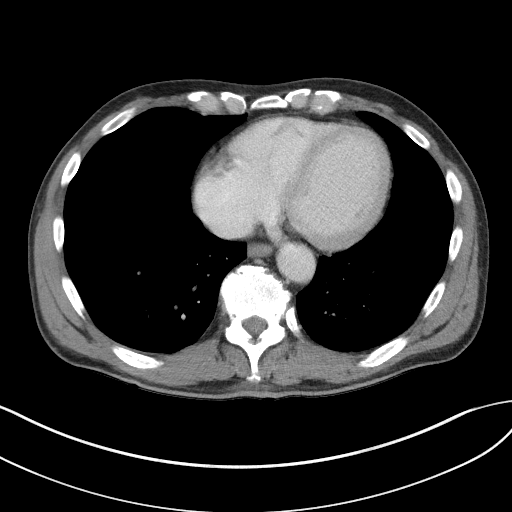

[Series 6: coronal st · coronal · 0.73mm/px · 3 of 91 slices shown]
[im 31/91  soft-tissue]
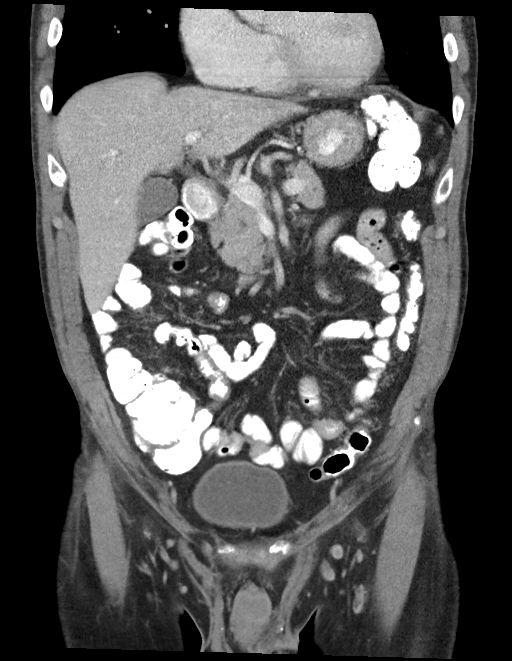
[im 41/91  soft-tissue]
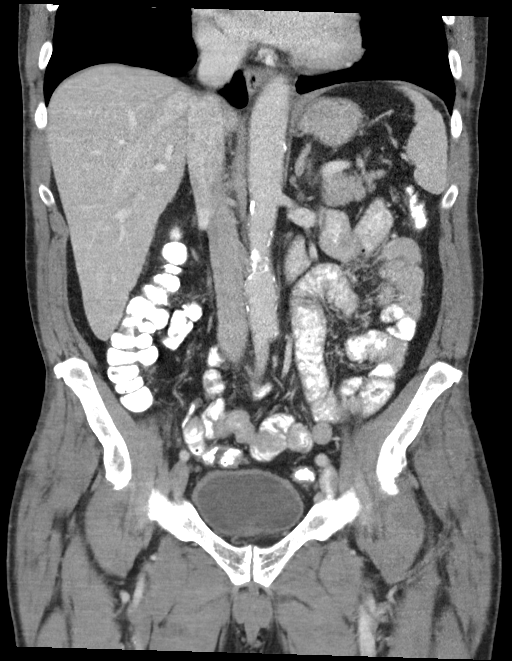
[im 51/91  soft-tissue]
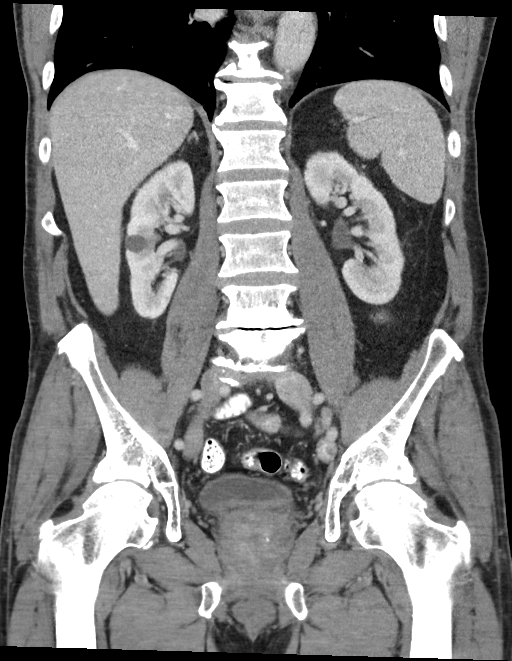

[16 of 46 positions shown; findings below may reference images not displayed]

FINDINGS: Lower chest: No acute abnormality.

Hepatobiliary: No focal liver abnormality is seen. No gallstones,
gallbladder wall thickening, or biliary dilatation.

Pancreas: Unremarkable. No pancreatic ductal dilatation or
surrounding inflammatory changes.

Spleen: Normal in size without focal abnormality.

Adrenals/Urinary Tract: Adrenal glands are within normal limits.
Kidneys are well visualized bilaterally. Renal cystic change is
noted on the right stable in appearance from the prior exam. No
renal calculi or obstructive changes are seen. The bladder is well
distended.

Stomach/Bowel: Scattered mild diverticulosis is seen. The appendix
is within normal limits. No obstructive or inflammatory changes are
seen. Small bowel and stomach appear within normal limits.

Vascular/Lymphatic: Aortic atherosclerosis. Mild dilatation of the
distal infrarenal aorta 2.9 cm is noted. No enlarged abdominal or
pelvic lymph nodes.

Reproductive: Prostate is unremarkable.

Other: No abdominal wall hernia or abnormality. No abdominopelvic
ascites.

Musculoskeletal: Degenerative changes are noted particularly at L4-5
and L5-S1.
IMPRESSION: Dilatation of the infrarenal aorta to 2.9 cm increased from the
prior exam at which time it measured 2.5 cm. No follow-up
recommended as the focal dilatation is not significantly dilated
with respect to the more proximal normal aorta. This recommendation
follows ACR consensus guidelines: White Paper of the ACR Incidental
Findings Committee II on Vascular Findings. [HOSPITAL] 7681;
[DATE].

Diverticulosis without diverticulitis.

Renal cystic change.

No findings to correspond with the patient's given clinical history
are noted.

## 2021-09-01 ENCOUNTER — Other Ambulatory Visit (INDEPENDENT_AMBULATORY_CARE_PROVIDER_SITE_OTHER): Payer: Self-pay | Admitting: Gastroenterology

## 2021-09-01 DIAGNOSIS — K219 Gastro-esophageal reflux disease without esophagitis: Secondary | ICD-10-CM

## 2021-09-03 NOTE — Telephone Encounter (Signed)
Needs office visit.

## 2021-09-18 ENCOUNTER — Other Ambulatory Visit (INDEPENDENT_AMBULATORY_CARE_PROVIDER_SITE_OTHER): Payer: Self-pay | Admitting: Gastroenterology

## 2021-09-18 DIAGNOSIS — K219 Gastro-esophageal reflux disease without esophagitis: Secondary | ICD-10-CM

## 2021-10-12 ENCOUNTER — Other Ambulatory Visit (INDEPENDENT_AMBULATORY_CARE_PROVIDER_SITE_OTHER): Payer: Self-pay | Admitting: Gastroenterology

## 2021-10-12 DIAGNOSIS — K219 Gastro-esophageal reflux disease without esophagitis: Secondary | ICD-10-CM

## 2021-10-15 NOTE — Telephone Encounter (Signed)
Last seen 06/12/2020 for LUQ pain. Next appointment 12/03/2021.

## 2021-10-19 ENCOUNTER — Other Ambulatory Visit (HOSPITAL_COMMUNITY): Payer: Self-pay | Admitting: Internal Medicine

## 2021-10-19 DIAGNOSIS — R059 Cough, unspecified: Secondary | ICD-10-CM

## 2021-10-29 ENCOUNTER — Other Ambulatory Visit: Payer: Self-pay

## 2021-10-29 ENCOUNTER — Ambulatory Visit (HOSPITAL_COMMUNITY)
Admission: RE | Admit: 2021-10-29 | Discharge: 2021-10-29 | Disposition: A | Discharge status: Home / Self Care | Payer: Medicare Other | Source: Ambulatory Visit | Attending: Internal Medicine | Admitting: Internal Medicine

## 2021-10-29 DIAGNOSIS — R059 Cough, unspecified: Secondary | ICD-10-CM | POA: Diagnosis not present

## 2021-12-03 ENCOUNTER — Ambulatory Visit (INDEPENDENT_AMBULATORY_CARE_PROVIDER_SITE_OTHER): Payer: Medicare Other | Admitting: Gastroenterology

## 2021-12-24 ENCOUNTER — Other Ambulatory Visit (INDEPENDENT_AMBULATORY_CARE_PROVIDER_SITE_OTHER): Payer: Self-pay | Admitting: Gastroenterology

## 2021-12-24 DIAGNOSIS — K219 Gastro-esophageal reflux disease without esophagitis: Secondary | ICD-10-CM

## 2022-01-02 ENCOUNTER — Other Ambulatory Visit (INDEPENDENT_AMBULATORY_CARE_PROVIDER_SITE_OTHER): Payer: Self-pay | Admitting: Gastroenterology

## 2022-01-02 DIAGNOSIS — K219 Gastro-esophageal reflux disease without esophagitis: Secondary | ICD-10-CM

## 2022-01-03 ENCOUNTER — Telehealth (INDEPENDENT_AMBULATORY_CARE_PROVIDER_SITE_OTHER): Payer: Self-pay | Admitting: *Deleted

## 2022-01-03 ENCOUNTER — Other Ambulatory Visit (INDEPENDENT_AMBULATORY_CARE_PROVIDER_SITE_OTHER): Payer: Self-pay | Admitting: Gastroenterology

## 2022-01-03 ENCOUNTER — Other Ambulatory Visit (INDEPENDENT_AMBULATORY_CARE_PROVIDER_SITE_OTHER): Payer: Self-pay | Admitting: *Deleted

## 2022-01-03 DIAGNOSIS — K219 Gastro-esophageal reflux disease without esophagitis: Secondary | ICD-10-CM

## 2022-01-03 MED ORDER — PANTOPRAZOLE SODIUM 40 MG PO TBEC: 40.0000 mg | DELAYED_RELEASE_TABLET | Freq: Every day | ORAL | 0 refills | Status: DC

## 2022-01-03 NOTE — Telephone Encounter (Signed)
Medication sent to pharmacy  

## 2022-01-03 NOTE — Telephone Encounter (Signed)
Please call patient and schedule appt. He left voicemail asking for refills ut has not been seen since 06/12/20.  ?

## 2022-01-03 NOTE — Telephone Encounter (Signed)
Last seen oct 2021. Made appt for 6/26. Can he have refill on pantoprazole to get him through to his appt. [facility] ?

## 2022-01-03 NOTE — Telephone Encounter (Signed)
Pt.notified

## 2022-01-11 ENCOUNTER — Other Ambulatory Visit (INDEPENDENT_AMBULATORY_CARE_PROVIDER_SITE_OTHER): Payer: Self-pay | Admitting: Gastroenterology

## 2022-01-11 DIAGNOSIS — K219 Gastro-esophageal reflux disease without esophagitis: Secondary | ICD-10-CM

## 2022-02-18 ENCOUNTER — Ambulatory Visit (INDEPENDENT_AMBULATORY_CARE_PROVIDER_SITE_OTHER): Payer: Medicare Other | Admitting: Gastroenterology

## 2022-02-18 ENCOUNTER — Encounter (INDEPENDENT_AMBULATORY_CARE_PROVIDER_SITE_OTHER): Payer: Self-pay | Admitting: Gastroenterology

## 2022-02-18 VITALS — Ht 70.0 in | Wt 180.0 lb

## 2022-02-18 DIAGNOSIS — K219 Gastro-esophageal reflux disease without esophagitis: Secondary | ICD-10-CM

## 2022-02-18 MED ORDER — FAMOTIDINE 20 MG PO TABS: 20.0000 mg | ORAL_TABLET | Freq: Every day | ORAL | 3 refills | Status: DC

## 2022-07-15 ENCOUNTER — Other Ambulatory Visit (INDEPENDENT_AMBULATORY_CARE_PROVIDER_SITE_OTHER): Payer: Self-pay | Admitting: Gastroenterology

## 2022-07-15 DIAGNOSIS — K219 Gastro-esophageal reflux disease without esophagitis: Secondary | ICD-10-CM

## 2022-10-14 ENCOUNTER — Other Ambulatory Visit (INDEPENDENT_AMBULATORY_CARE_PROVIDER_SITE_OTHER): Payer: Self-pay | Admitting: Gastroenterology

## 2022-10-14 DIAGNOSIS — K219 Gastro-esophageal reflux disease without esophagitis: Secondary | ICD-10-CM

## 2022-10-30 ENCOUNTER — Other Ambulatory Visit (HOSPITAL_COMMUNITY): Payer: Self-pay | Admitting: Internal Medicine

## 2022-10-30 DIAGNOSIS — R059 Cough, unspecified: Secondary | ICD-10-CM

## 2022-12-27 ENCOUNTER — Ambulatory Visit (HOSPITAL_COMMUNITY)
Admission: RE | Admit: 2022-12-27 | Discharge: 2022-12-27 | Disposition: A | Discharge status: Home / Self Care | Payer: Medicare Other | Source: Ambulatory Visit | Attending: Internal Medicine | Admitting: Internal Medicine

## 2022-12-27 DIAGNOSIS — R059 Cough, unspecified: Secondary | ICD-10-CM | POA: Diagnosis present

## 2023-01-11 ENCOUNTER — Other Ambulatory Visit (INDEPENDENT_AMBULATORY_CARE_PROVIDER_SITE_OTHER): Payer: Self-pay | Admitting: Gastroenterology

## 2023-01-11 DIAGNOSIS — K219 Gastro-esophageal reflux disease without esophagitis: Secondary | ICD-10-CM

## 2023-02-07 ENCOUNTER — Other Ambulatory Visit (INDEPENDENT_AMBULATORY_CARE_PROVIDER_SITE_OTHER): Payer: Self-pay | Admitting: Gastroenterology

## 2023-02-07 DIAGNOSIS — K219 Gastro-esophageal reflux disease without esophagitis: Secondary | ICD-10-CM

## 2023-02-20 ENCOUNTER — Encounter (INDEPENDENT_AMBULATORY_CARE_PROVIDER_SITE_OTHER): Payer: Self-pay | Admitting: Gastroenterology

## 2023-02-20 ENCOUNTER — Ambulatory Visit (INDEPENDENT_AMBULATORY_CARE_PROVIDER_SITE_OTHER): Payer: Medicare Other | Admitting: Gastroenterology

## 2023-02-20 DIAGNOSIS — K219 Gastro-esophageal reflux disease without esophagitis: Secondary | ICD-10-CM | POA: Diagnosis not present

## 2023-02-20 MED ORDER — FAMOTIDINE 20 MG PO TABS: 20.0000 mg | ORAL_TABLET | Freq: Every day | ORAL | 3 refills | Status: DC

## 2023-02-20 MED ORDER — PANTOPRAZOLE SODIUM 40 MG PO TBEC
DELAYED_RELEASE_TABLET | ORAL | 3 refills | Status: DC
Start: 2023-02-20 — End: 2024-03-09

## 2023-02-20 NOTE — Patient Instructions (Signed)
Continue with protonix 40mg  daily, famotidine 20mg  in the evenings Be mindful of greasy, spicy, fried, citrus foods, and be mindful that caffeine, carbonated drinks, chocolate and alcohol can increase reflux symptoms. Stay upright 2-3 hours after eating, prior to lying down and avoid eating late in the evenings.   We will plan for repeat colonoscopy around march 2025  Follow up 1 year  It was a pleasure to see you today. I want to create trusting relationships with patients and provide genuine, compassionate, and quality care. I truly value your feedback! please be on the lookout for a survey regarding your visit with me today. I appreciate your input about our visit and your time in completing this!    Zachary Lalone L. Jeanmarie Hubert, MSN, APRN, AGNP-C Adult-Gerontology Nurse Practitioner [facility] Gastroenterology at [facility]

## 2023-02-20 NOTE — Progress Notes (Addendum)
Primary Care Physician:  Elfredia Nevins, MD  Primary GI: Levon Hedger   Patient Location: Home   Provider Location: [place] [facility] office   Reason for Visit: follow up on GERD   Persons present on the virtual encounter, with roles: Jase Reep L. Jeanmarie Hubert, MSN, APRN, AGNP-C, Mack Hook, patient    Total time (minutes) spent on medical discussion: ### minutes  Virtual Visit via MyChart Video Note visit is conducted virtually and was requested by patient.   I connected with Mack Hook on 02/20/23 at  2:15 PM EDT by telephone and verified that I am speaking with the correct person using two identifiers.   I discussed the limitations, risks, security and privacy concerns of performing an evaluation and management service by telephone and the availability of in person appointments. I also discussed with the patient that there may be a patient responsible charge related to this service. The patient expressed understanding and agreed to proceed.  Chief Complaint  Patient presents with   Follow-up    Patient here today for a follow up on Gerd. He takes pantoprazole 40 mg once per day and famotidine 20 mg at bedtime. These do control his symptoms.    History of Present Illness: Zachary Gentry is a 77 y.o. old male with a past medical history of arthritis, COPD, hypertension, colon polyps and GERD   At last visit in June 2023, doing well on protonix and famotidine, avoiding trigger foods. Occasional burning in chest and throat. Does not take anything for breakthrough symptoms   Recommended protonix 40mg , continue pepcid 20mg , repeat Colonoscopy 2025  Present:  Feels that GERD is well controlled for the most part on protonix 40mg  daily and famotidine 20mg  daily. He notes that he has occasional breakthrough if he eats something specific like tomatoes but tries to only do trigger foods in moderation. Denies any need for supplemental GERD therapy during times of breakthrough such as  tums/zantac. No red flag symptoms. Patient denies melena, hematochezia, nausea, vomiting, diarrhea, constipation, dysphagia, odyonophagia, early satiety or weight loss.   Last EGD:-nov 2021  Normal esophagus. - A few gastric polyps. Biopsied.benign - Normal examined duodenum. Last Colonoscopy: March 2018- The entire examined colon is normal. - External hemorrhoids. - No specimens collected.   Repeat Colonoscopy in March 2025 d/t personal hx of colon polyps   Past Medical History:  Diagnosis Date   Arthritis    COPD (chronic obstructive pulmonary disease) (HCC)    Hemorrhoids    Hernia, inguinal    History of colon polyps    Hypertension    Sleep apnea      Past Surgical History:  Procedure Laterality Date   BACK SURGERY  '79   BIOPSY  07/14/2020   Procedure: BIOPSY;  Surgeon: Dolores Frame, MD;  [facility];  Service: Gastroenterology;;   COLONOSCOPY  10/02/2011   Procedure: COLONOSCOPY;  Surgeon: Malissa Hippo, MD;  [facility];  Service: Endoscopy;  Laterality: N/A;  930   COLONOSCOPY N/A 11/06/2016   Procedure: COLONOSCOPY;  Surgeon: Malissa Hippo, MD;  [facility];  Service: Endoscopy;  Laterality: N/A;  200   ESOPHAGOGASTRODUODENOSCOPY (EGD) WITH PROPOFOL N/A 07/14/2020   Procedure: ESOPHAGOGASTRODUODENOSCOPY (EGD) WITH PROPOFOL;  Surgeon: Dolores Frame, MD;  [facility];  Service: Gastroenterology;  Laterality: N/A;  300   INGUINAL HERNIA REPAIR  2015   left knee surgery  '96   right knee surgery  2007  SHOULDER SURGERY       Current Meds  Medication Sig   amoxicillin (AMOXIL) 500 MG capsule Take 500 mg by mouth See admin instructions. Take 2000 mg by mouth one hour prior to dental procedure   ARTIFICIAL TEAR OP Place 1 drop into both eyes 3 (three) times daily as needed (dry eyes).    Ascorbic Acid (VITAMIN C) 1000 MG tablet Take 1,000 mg by mouth daily.    aspirin EC 81 MG tablet Take 81  mg by mouth daily.   Cholecalciferol (VITAMIN D3) 125 MCG (5000 UT) TABS Take 5,000 Units by mouth daily.    Coenzyme Q10 (COQ10 PO) Take 1 capsule by mouth daily with supper.    diazepam (VALIUM) 10 MG tablet Take 5 mg by mouth daily as needed for anxiety.    famotidine (PEPCID) 20 MG tablet TAKE 1 TABLET(20 MG) BY MOUTH AT BEDTIME   Fluocinolone Acetonide Scalp 0.01 % OIL Apply 1 application topically once a week.   fluticasone (FLONASE) 50 MCG/ACT nasal spray Place 1-2 sprays into both nostrils daily as needed for allergies or rhinitis.   hydrochlorothiazide (HYDRODIURIL) 12.5 MG tablet Take 12.5 mg by mouth daily.   losartan (COZAAR) 50 MG tablet Take 50 mg by mouth daily.   meloxicam (MOBIC) 15 MG tablet Take 15 mg by mouth daily as needed. With food   milk thistle 175 MG tablet Take 350 mg by mouth daily.   Multiple Vitamins-Minerals (EMERGEN-C VITAMIN C) PACK Take 1 Package by mouth 3 (three) times a week.   Omega-3 Fatty Acids (FISH OIL PO) Take 1 capsule by mouth daily.    pantoprazole (PROTONIX) 40 MG tablet TAKE 1 TABLET(40 MG) BY MOUTH DAILY   TESTOSTERONE IM Inject 200 mg into the muscle every 30 (thirty) days.    zolpidem (AMBIEN) 10 MG tablet Take 5 mg by mouth at bedtime as needed for sleep.      Family History  Problem Relation Age of Onset   Liver cancer Mother    Healthy Sister    Healthy Brother     Social History   Socioeconomic History   Marital status: Divorced    Spouse name: Not on file   Number of children: Not on file   Years of education: Not on file   Highest education level: Not on file  Occupational History   Not on file  Tobacco Use   Smoking status: Never    Passive exposure: Never   Smokeless tobacco: Never  Vaping Use   Vaping Use: Never used  Substance and Sexual Activity   Alcohol use: Yes    Comment: couple of beers per day   Drug use: No   Sexual activity: Not on file  Other Topics Concern   Not on file  Social History Narrative    Not on file   Social Determinants of Health   Financial Resource Strain: Not on file  Food Insecurity: Not on file  Transportation Needs: Not on file  Physical Activity: Not on file  Stress: Not on file  Social Connections: Not on file   Review of Systems: Gen: Denies fever, chills, anorexia. Denies fatigue, weakness, weight loss.  CV: Denies chest pain, palpitations, syncope, peripheral edema, and claudication. Resp: Denies dyspnea at rest, cough, wheezing, coughing up blood, and pleurisy. GI: see HPI Derm: Denies rash, itching, dry skin Psych: Denies depression, anxiety, memory loss, confusion. No homicidal or suicidal ideation.  Heme: Denies bruising, bleeding, and enlarged lymph nodes.  Observations/Objective:  No distress. Unable to perform physical exam due to telephone encounter. No video available.   Assessment and Plan: NILES ESS is a 77 y.o. old male with a past medical history of arthritis, COPD, hypertension, colon polyps and GERD, who presents for follow up of GERD  GERD: relatively well controlled on protonix 40mg  daily and famotidine 20mg  daily unless he eats something specific that triggers his symptoms which he tries to do only in moderation. Not requiring any other supplemental GERD therapy. Will continue with current regimen and good reflux precautions.  CRC screening/history of colon polyps in the past: last Colonoscopy in 2018, recommended repeat in march 2025, will ensure he is on recall list to be schedule for this closer to due time.   -reflux precautions -continue with protonix 40mg  daily -continue with famotidine 20mg  QHS -plan for colonoscopy march 2025   Follow Up Instructions: 1 year   I discussed the assessment and treatment plan with the patient. The patient was provided an opportunity to ask questions and all were answered. The patient agreed with the plan and demonstrated an understanding of the instructions.   The patient was advised  to call back or seek an in-person evaluation if the symptoms worsen or if the condition fails to improve as anticipated.  I provided 5 minutes of NON face-to-face time during this telephone encounter.  Anab Vivar L. Jeanmarie Hubert, MSN, APRN, AGNP-C Adult-Gerontology Nurse Practitioner [facility]  I have reviewed the note and agree with the APP's assessment as described in this progress note  Katrinka Blazing, MD Gastroenterology and Hepatology [facility] Gastroenterology

## 2023-03-28 ENCOUNTER — Other Ambulatory Visit (HOSPITAL_COMMUNITY): Payer: Self-pay | Admitting: Internal Medicine

## 2023-03-28 DIAGNOSIS — S0990XA Unspecified injury of head, initial encounter: Secondary | ICD-10-CM

## 2023-03-28 DIAGNOSIS — R519 Headache, unspecified: Secondary | ICD-10-CM

## 2023-10-13 ENCOUNTER — Encounter (INDEPENDENT_AMBULATORY_CARE_PROVIDER_SITE_OTHER): Payer: Self-pay | Admitting: *Deleted

## 2023-10-31 ENCOUNTER — Other Ambulatory Visit (HOSPITAL_COMMUNITY): Payer: Self-pay | Admitting: Internal Medicine

## 2023-10-31 DIAGNOSIS — J439 Emphysema, unspecified: Secondary | ICD-10-CM

## 2023-11-18 ENCOUNTER — Ambulatory Visit (HOSPITAL_COMMUNITY)
Admission: RE | Admit: 2023-11-18 | Discharge: 2023-11-18 | Disposition: A | Discharge status: Home / Self Care | Source: Ambulatory Visit | Attending: Internal Medicine | Admitting: Internal Medicine

## 2023-11-18 DIAGNOSIS — J4489 Other specified chronic obstructive pulmonary disease: Secondary | ICD-10-CM | POA: Diagnosis present

## 2023-11-18 DIAGNOSIS — J439 Emphysema, unspecified: Secondary | ICD-10-CM | POA: Insufficient documentation

## 2024-02-19 ENCOUNTER — Ambulatory Visit (INDEPENDENT_AMBULATORY_CARE_PROVIDER_SITE_OTHER): Payer: Medicare Other | Admitting: Gastroenterology

## 2024-02-23 ENCOUNTER — Other Ambulatory Visit (INDEPENDENT_AMBULATORY_CARE_PROVIDER_SITE_OTHER): Payer: Self-pay | Admitting: Gastroenterology

## 2024-02-23 DIAGNOSIS — K219 Gastro-esophageal reflux disease without esophagitis: Secondary | ICD-10-CM

## 2024-02-24 ENCOUNTER — Other Ambulatory Visit (INDEPENDENT_AMBULATORY_CARE_PROVIDER_SITE_OTHER): Payer: Self-pay | Admitting: Gastroenterology

## 2024-02-24 DIAGNOSIS — K219 Gastro-esophageal reflux disease without esophagitis: Secondary | ICD-10-CM

## 2024-02-24 NOTE — Telephone Encounter (Signed)
 Needs to keep upcoming appointment scheduled for 03/09/2024.

## 2024-03-09 ENCOUNTER — Encounter (INDEPENDENT_AMBULATORY_CARE_PROVIDER_SITE_OTHER): Payer: Self-pay | Admitting: Gastroenterology

## 2024-03-09 ENCOUNTER — Ambulatory Visit (INDEPENDENT_AMBULATORY_CARE_PROVIDER_SITE_OTHER): Admitting: Gastroenterology

## 2024-03-09 VITALS — BP 128/76 | HR 64 | Temp 97.5°F | Ht 70.75 in | Wt 185.5 lb

## 2024-03-09 DIAGNOSIS — K219 Gastro-esophageal reflux disease without esophagitis: Secondary | ICD-10-CM | POA: Diagnosis not present

## 2024-03-09 DIAGNOSIS — Z860101 Personal history of adenomatous and serrated colon polyps: Secondary | ICD-10-CM | POA: Diagnosis not present

## 2024-03-09 DIAGNOSIS — Z860100 Personal history of colon polyps, unspecified: Secondary | ICD-10-CM

## 2024-03-09 MED ORDER — PANTOPRAZOLE SODIUM 40 MG PO TBEC
DELAYED_RELEASE_TABLET | ORAL | 3 refills | Status: DC
Start: 2024-03-09 — End: 2024-04-19

## 2024-03-09 MED ORDER — FAMOTIDINE 20 MG PO TABS
20.0000 mg | ORAL_TABLET | Freq: Every day | ORAL | 3 refills | Status: AC
Start: 2024-03-09 — End: ?

## 2024-03-09 NOTE — Patient Instructions (Signed)
 We will get you scheduled for colonoscopy I have sent refills of pantoprazole  and famotidine , we will continue these daily Be mindful of greasy, spicy, fried, citrus foods, caffeine, carbonated drinks, chocolate and alcohol as these can increase reflux symptoms Stay upright 2-3 hours after eating, prior to lying down and avoid eating late in the evenings.  Follow up 1 year  It was a pleasure to see you today. I want to create trusting relationships with patients and provide genuine, compassionate, and quality care. I truly value your feedback! please be on the lookout for a survey regarding your visit with me today. I appreciate your input about our visit and your time in completing this!    Kveon Casanas L. Reise Gladney, MSN, APRN, AGNP-C Adult-Gerontology Nurse Practitioner [facility] Gastroenterology at [facility]

## 2024-03-09 NOTE — Progress Notes (Signed)
 Referring Provider: Bertell Satterfield, MD Primary Care Physician:  Bertell Satterfield, MD Primary GI Physician: Dr. Eartha   Chief Complaint  Patient presents with   Follow-up    Patient here today for a follow up on Gerd. Patient on pantoprazole  40 mg every day and he takes Famotidine  20 mg at bedtime. Patient denies any current issues with GERD.   HPI:   Zachary Gentry is a 78 y.o. male with past medical history of arthritis, COPD, HTN, Colon polyps and GERD  Patient presenting today for:  Follow up of GERD and to schedule colonoscopy due to history of colon polyps  Last seen June 2024, virtually. At that time GED well controlled on protonix  40mg  daily, famotidine  20mg . Occasional breakthrough with spicy/tomato based foods, sometimes using tums/zantac when this occurs.   Recommended continue protonix  40mg  daily, famotidine  20mg  at bedtime, plan for colonoscopy march 2025   Repeat Colonoscopy was not done   Present:  Doing well today, taking protonix  40mg  daily and famotidine  20mg , noting he takes his famotidine  late at night as he has to take medication to help him sleep due to chronic neck pain and this timing seems to work best for him. denies breakthrough symptoms. No dysphagia or odynophagia.   He states he never received any recall on having his colonoscopy in march. Denies changes in bowels, rectal bleeding or melena. He is amenable to scheduling colonoscopy.   He notes he is very active and tries to get plenty of exercise.   Last EGD:-nov 2021  Normal esophagus. - A few gastric polyps. Biopsied.benign - Normal examined duodenum. Last Colonoscopy: March 2018- The entire examined colon is normal. - External hemorrhoids. - No specimens collected.   Repeat Colonoscopy in March 2025 d/t personal hx of colon polyps  Filed Weights   03/09/24 1404  Weight: 185 lb 8 oz (84.1 kg)     Past Medical History:  Diagnosis Date   Arthritis    COPD (chronic obstructive  pulmonary disease) (HCC)    Hemorrhoids    Hernia, inguinal    History of colon polyps    Hypertension    Sleep apnea     Past Surgical History:  Procedure Laterality Date   BACK SURGERY  '79   BIOPSY  07/14/2020   Procedure: BIOPSY;  Surgeon: Eartha Angelia Sieving, MD;  [facility];  Service: Gastroenterology;;   COLONOSCOPY  10/02/2011   Procedure: COLONOSCOPY;  Surgeon: Claudis RAYMOND Rivet, MD;  [facility];  Service: Endoscopy;  Laterality: N/A;  930   COLONOSCOPY N/A 11/06/2016   Procedure: COLONOSCOPY;  Surgeon: Claudis RAYMOND Rivet, MD;  [facility];  Service: Endoscopy;  Laterality: N/A;  200   ESOPHAGOGASTRODUODENOSCOPY (EGD) WITH PROPOFOL  N/A 07/14/2020   Procedure: ESOPHAGOGASTRODUODENOSCOPY (EGD) WITH PROPOFOL ;  Surgeon: Eartha Angelia Sieving, MD;  [facility];  Service: Gastroenterology;  Laterality: N/A;  300   INGUINAL HERNIA REPAIR  2015   left knee surgery  '96   right knee surgery  2007   SHOULDER SURGERY      Current Outpatient Medications  Medication Sig Dispense Refill   ARTIFICIAL TEAR OP Place 1 drop into both eyes 3 (three) times daily as needed (dry eyes).      Ascorbic Acid (VITAMIN C) 1000 MG tablet Take 1,000 mg by mouth daily.      aspirin EC 81 MG tablet Take 81 mg by mouth daily.     Cholecalciferol (VITAMIN D3) 125 MCG (5000  UT) TABS Take 5,000 Units by mouth daily.      Coenzyme Q10 (COQ10 PO) Take 1 capsule by mouth daily with supper.      diazepam (VALIUM) 10 MG tablet Take 5 mg by mouth daily as needed for anxiety.      famotidine  (PEPCID ) 20 MG tablet TAKE 1 TABLET(20 MG) BY MOUTH AT BEDTIME 90 tablet 0   Fluocinolone Acetonide Scalp 0.01 % OIL Apply 1 application topically once a week.     fluticasone (FLONASE) 50 MCG/ACT nasal spray Place 1-2 sprays into both nostrils daily as needed for allergies or rhinitis.     hydrochlorothiazide (HYDRODIURIL) 12.5 MG tablet Take 12.5 mg by mouth daily.      ibuprofen (ADVIL) 600 MG tablet Take 600 mg by mouth every 6 (six) hours as needed.     losartan (COZAAR) 50 MG tablet Take 50 mg by mouth daily.     meloxicam (MOBIC) 15 MG tablet Take 15 mg by mouth daily as needed. With food     milk thistle 175 MG tablet Take 350 mg by mouth daily.     Multiple Vitamins-Minerals (EMERGEN-C VITAMIN C) PACK Take 1 Package by mouth 3 (three) times a week.     Omega-3 Fatty Acids (FISH OIL PO) Take 1 capsule by mouth daily.  (Patient taking differently: Take 1 capsule by mouth daily. As needed.)     pantoprazole  (PROTONIX ) 40 MG tablet TAKE 1 TABLET(40 MG) BY MOUTH DAILY 90 tablet 3   TESTOSTERONE  IM Inject 200 mg into the muscle every 30 (thirty) days.      zolpidem (AMBIEN) 10 MG tablet Take 5 mg by mouth at bedtime as needed for sleep.      No current facility-administered medications for this visit.    Allergies as of 03/09/2024 - Review Complete 03/09/2024  Allergen Reaction Noted   Hydrocodone-acetaminophen Itching 07/07/2020    Social History   Socioeconomic History   Marital status: Divorced    Spouse name: Not on file   Number of children: Not on file   Years of education: Not on file   Highest education level: Not on file  Occupational History   Not on file  Tobacco Use   Smoking status: Never    Passive exposure: Never   Smokeless tobacco: Never  Vaping Use   Vaping status: Never Used  Substance and Sexual Activity   Alcohol use: Yes    Comment: couple of beers per day   Drug use: No   Sexual activity: Not on file  Other Topics Concern   Not on file  Social History Narrative   Not on file   Social Drivers of Health   Financial Resource Strain: Not on file  Food Insecurity: Not on file  Transportation Needs: Not on file  Physical Activity: Not on file  Stress: Not on file  Social Connections: Not on file    Review of systems General: negative for malaise, night sweats, fever, chills, weight loss Neck: Negative for  lumps, goiter, pain and significant neck swelling Resp: Negative for cough, wheezing, dyspnea at rest CV: Negative for chest pain, leg swelling, palpitations, orthopnea GI: denies melena, hematochezia, nausea, vomiting, diarrhea, constipation, dysphagia, odyonophagia, early satiety or unintentional weight loss.  MSK: Negative for joint pain or swelling, back pain, and muscle pain. Derm: Negative for itching or rash Psych: Denies depression, anxiety, memory loss, confusion. No homicidal or suicidal ideation.  Heme: Negative for prolonged bleeding, bruising easily, and swollen nodes. Endocrine:  Negative for cold or heat intolerance, polyuria, polydipsia and goiter. Neuro: negative for tremor, gait imbalance, syncope and seizures. The remainder of the review of systems is noncontributory.  Physical Exam: BP 128/76 (BP Location: Left Arm, Patient Position: Sitting, Cuff Size: Normal)   Pulse 64   Temp (!) 97.5 F (36.4 C) (Temporal)   Ht 5' 10.75 (1.797 m)   Wt 185 lb 8 oz (84.1 kg)   BMI 26.06 kg/m  General:   Alert and oriented. No distress noted. Pleasant and cooperative.  Head:  Normocephalic and atraumatic. Eyes:  Conjuctiva clear without scleral icterus. Mouth:  Oral mucosa pink and moist. Good dentition. No lesions. Heart: Normal rate and rhythm, s1 and s2 heart sounds present.  Lungs: Clear lung sounds in all lobes. Respirations equal and unlabored. Abdomen:  +BS, soft, non-tender and non-distended. No rebound or guarding. No HSM or masses noted. Derm: No palmar erythema or jaundice Msk:  Symmetrical without gross deformities. Normal posture. Extremities:  Without edema. Neurologic:  Alert and  oriented x4 Psych:  Alert and cooperative. Normal mood and affect.  Invalid input(s): 6 MONTHS   ASSESSMENT: Barlow H Grizzell is a 78 y.o. male presenting today for follow up of GERD and to schedule colonoscopy due to history of colon polyps  GERD: well managed on protonix  40mg   daily, famotidine  20mg  at night. He denies breakthrough reflux symptoms, dysphagia, odynophagia. Will continue with current daily PPI/H2B regimen and good reflux precautions.  History of Colon polyps: last TCS in march 2018, no polyps at that time but due to history of tubular adenoma was recommended to have repeat in 7 years. He was lost to follow up for this in march. We will get him scheduled for colonoscopy. Indications, risks and benefits of procedure discussed in detail with patient. Patient verbalized understanding and is in agreement to proceed with Colonoscopy.     PLAN:  -continue protonix  40mg  daily -continue famotidine  20mg  at bedtime -good reflux precautions -schedule colonoscopy, ASA III  All questions were answered, patient verbalized understanding and is in agreement with plan as outlined above.   Follow Up: 1 year   Kamaal Cast L. Mariette, MSN, APRN, AGNP-C Adult-Gerontology Nurse Practitioner [facility] for GI Diseases  I have reviewed the note and agree with the APP's assessment as described in this progress note  Toribio Fortune, MD Gastroenterology and Hepatology [facility] Gastroenterology

## 2024-03-10 ENCOUNTER — Other Ambulatory Visit: Payer: Self-pay | Admitting: *Deleted

## 2024-03-10 ENCOUNTER — Encounter: Payer: Self-pay | Admitting: *Deleted

## 2024-03-10 MED ORDER — PEG 3350-KCL-NA BICARB-NACL 420 G PO SOLR: 4000.0000 mL | Freq: Once | ORAL | 0 refills | Status: AC

## 2024-04-12 ENCOUNTER — Other Ambulatory Visit (HOSPITAL_COMMUNITY)

## 2024-04-13 NOTE — Patient Instructions (Signed)
 Zachary Gentry  04/13/2024     @PREFPERIOPPHARMACY @   Your procedure is scheduled on 04/16/2024.   Report to [facility] at  [phone] A.M.   Call this number if you have problems the morning of surgery:  [phone]  If you experience any cold or flu symptoms such as cough, fever, chills, shortness of breath, etc. between now and your scheduled surgery, please notify us  at the above number.   Remember:  Do not eat or drink after midnight.   You may drink clear liquids until 0400 am on 04/16/2024.    Clear liquids allowed are:                    Water , Juice (No red color; non-citric and without pulp; diabetics please choose diet or no sugar options), Carbonated beverages (diabetics please choose diet or no sugar options), Clear Tea (No creamer, milk, or cream, including half & half and powdered creamer), Black Coffee Only (No creamer, milk or cream, including half & half and powdered creamer), and Clear Sports drink (No red color; diabetics please choose diet or no sugar options)    Take these medicines the morning of surgery with A SIP OF WATER                               meloxicam(if needed), pantoprazole .    Do not wear jewelry, make-up or nail polish, including gel polish,  artificial nails, or any other type of covering on natural nails (fingers and  toes).  Do not wear lotions, powders, or perfumes, or deodorant.  Do not shave 48 hours prior to surgery.  Men may shave face and neck.  Do not bring valuables to the hospital.  [facility] is not responsible for any belongings or valuables.  Contacts, dentures or bridgework may not be worn into surgery.  Leave your suitcase in the car.  After surgery it may be brought to your room.  For patients admitted to the hospital, discharge time will be determined by your treatment team.  Patients discharged the day of surgery will not be allowed to drive home and must have someone with them for 24 hours.    Special  instructions:   DO NOT smoke tobacco or vape for 24 hours before your procedure.  Please read over the following fact sheets that you were given. Anesthesia Post-op Instructions and Care and Recovery After Surgery      Colonoscopy, Adult, Care After The following information offers guidance on how to care for yourself after your procedure. Your health care provider may also give you more specific instructions. If you have problems or questions, contact your health care provider. What can I expect after the procedure? After the procedure, it is common to have: A small amount of blood in your stool for 24 hours after the procedure. Some gas. Mild cramping or bloating of your abdomen. Follow these instructions at home: Eating and drinking  Drink enough fluid to keep your urine pale yellow. Follow instructions from your health care provider about eating or drinking restrictions. Resume your normal diet as told by your health care provider. Avoid heavy or fried foods that are hard to digest. Activity Rest as told by your health care provider. Avoid sitting for a long time without moving. Get up to take short walks every 1-2 hours. This is important to improve blood flow and breathing. Ask  for help if you feel weak or unsteady. Return to your normal activities as told by your health care provider. Ask your health care provider what activities are safe for you. Managing cramping and bloating  Try walking around when you have cramps or feel bloated. If directed, apply heat to your abdomen as told by your health care provider. Use the heat source that your health care provider recommends, such as a moist heat pack or a heating pad. Place a towel between your skin and the heat source. Leave the heat on for 20-30 minutes. Remove the heat if your skin turns bright red. This is especially important if you are unable to feel pain, heat, or cold. You have a greater risk of getting burned. General  instructions If you were given a sedative during the procedure, it can affect you for several hours. Do not drive or operate machinery until your health care provider says that it is safe. For the first 24 hours after the procedure: Do not sign important documents. Do not drink alcohol. Do your regular daily activities at a slower pace than normal. Eat soft foods that are easy to digest. Take over-the-counter and prescription medicines only as told by your health care provider. Keep all follow-up visits. This is important. Contact a health care provider if: You have blood in your stool 2-3 days after the procedure. Get help right away if: You have more than a small spotting of blood in your stool. You have large blood clots in your stool. You have swelling of your abdomen. You have nausea or vomiting. You have a fever. You have increasing pain in your abdomen that is not relieved with medicine. These symptoms may be an emergency. Get help right away. Call 911. Do not wait to see if the symptoms will go away. Do not drive yourself to the hospital. Summary After the procedure, it is common to have a small amount of blood in your stool. You may also have mild cramping and bloating of your abdomen. If you were given a sedative during the procedure, it can affect you for several hours. Do not drive or operate machinery until your health care provider says that it is safe. Get help right away if you have a lot of blood in your stool, nausea or vomiting, a fever, or increased pain in your abdomen. This information is not intended to replace advice given to you by your health care provider. Make sure you discuss any questions you have with your health care provider. Document Revised: 09/24/2022 Document Reviewed: 04/04/2021 Elsevier Patient Education  2024 [employer]General Anesthesia, Adult, Care After The following information offers guidance on how to care for yourself after your  procedure. Your health care provider may also give you more specific instructions. If you have problems or questions, contact your health care provider. What can I expect after the procedure? After the procedure, it is common for people to: Have pain or discomfort at the IV site. Have nausea or vomiting. Have a sore throat or hoarseness. Have trouble concentrating. Feel cold or chills. Feel weak, sleepy, or tired (fatigue). Have soreness and body aches. These can affect parts of the body that were not involved in surgery. Follow these instructions at home: For the time period you were told by your health care provider:  Rest. Do not participate in activities where you could fall or become injured. Do not drive or use machinery. Do not drink alcohol. Do not take sleeping pills or medicines  that cause drowsiness. Do not make important decisions or sign legal documents. Do not take care of children on your own. General instructions Drink enough fluid to keep your urine pale yellow. If you have sleep apnea, surgery and certain medicines can increase your risk for breathing problems. Follow instructions from your health care provider about wearing your sleep device: Anytime you are sleeping, including during daytime naps. While taking prescription pain medicines, sleeping medicines, or medicines that make you drowsy. Return to your normal activities as told by your health care provider. Ask your health care provider what activities are safe for you. Take over-the-counter and prescription medicines only as told by your health care provider. Do not use any products that contain nicotine or tobacco. These products include cigarettes, chewing tobacco, and vaping devices, such as e-cigarettes. These can delay incision healing after surgery. If you need help quitting, ask your health care provider. Contact a health care provider if: You have nausea or vomiting that does not get better with  medicine. You vomit every time you eat or drink. You have pain that does not get better with medicine. You cannot urinate or have bloody urine. You develop a skin rash. You have a fever. Get help right away if: You have trouble breathing. You have chest pain. You vomit blood. These symptoms may be an emergency. Get help right away. Call 911. Do not wait to see if the symptoms will go away. Do not drive yourself to the hospital. Summary After the procedure, it is common to have a sore throat, hoarseness, nausea, vomiting, or to feel weak, sleepy, or fatigue. For the time period you were told by your health care provider, do not drive or use machinery. Get help right away if you have difficulty breathing, have chest pain, or vomit blood. These symptoms may be an emergency. This information is not intended to replace advice given to you by your health care provider. Make sure you discuss any questions you have with your health care provider. Document Revised: 11/09/2021 Document Reviewed: 11/09/2021 Elsevier Patient Education  2024 [employer].

## 2024-04-15 ENCOUNTER — Encounter (HOSPITAL_COMMUNITY): Payer: Self-pay

## 2024-04-15 ENCOUNTER — Encounter: Payer: Self-pay | Admitting: *Deleted

## 2024-04-15 ENCOUNTER — Other Ambulatory Visit: Payer: Self-pay

## 2024-04-15 ENCOUNTER — Encounter (HOSPITAL_COMMUNITY)
Admission: RE | Admit: 2024-04-15 | Discharge: 2024-04-15 | Disposition: A | Discharge status: Home / Self Care | Source: Ambulatory Visit | Attending: Gastroenterology | Admitting: Gastroenterology

## 2024-04-15 ENCOUNTER — Telehealth: Payer: Self-pay | Admitting: *Deleted

## 2024-04-15 VITALS — BP 133/70 | HR 63 | Resp 18 | Ht 70.5 in | Wt 185.4 lb

## 2024-04-15 DIAGNOSIS — I10 Essential (primary) hypertension: Secondary | ICD-10-CM | POA: Diagnosis not present

## 2024-04-15 DIAGNOSIS — Z79899 Other long term (current) drug therapy: Secondary | ICD-10-CM | POA: Diagnosis not present

## 2024-04-15 DIAGNOSIS — Z01818 Encounter for other preprocedural examination: Secondary | ICD-10-CM | POA: Diagnosis present

## 2024-04-15 LAB — BASIC METABOLIC PANEL WITH GFR
Anion gap: 10 (ref 5–15)
BUN: 19 mg/dL (ref 8–23)
CO2: 23 mmol/L (ref 22–32)
Calcium: 8.9 mg/dL (ref 8.9–10.3)
Chloride: 102 mmol/L (ref 98–111)
Creatinine, Ser: 0.97 mg/dL (ref 0.61–1.24)
GFR, Estimated: >60 mL/min (ref >60)
Glucose, Bld: 93 mg/dL (ref 70–99)
Potassium: 4 mmol/L (ref 3.5–5.1)
Sodium: 135 mmol/L (ref 135–145)

## 2024-04-15 NOTE — Telephone Encounter (Signed)
 Pt has been scheduled for 05/11/24. Pt will come by office to pick up instructions on 05/04/24

## 2024-04-15 NOTE — Telephone Encounter (Signed)
 No instructions Received: Today Rhenda Luke GAILS, RN  Matalyn Nawaz LITTIE, LPN; Jeanell Graeme RAMAN, CMA; Eartha Angelia Sieving, MD Hey! I have Lynwood Collier here and has a TCS scheduled tomorrow. He states he didn't have any instructions and he had cabbage and cornbread last night and had yogart and black berries for breakfast. DO we need to reschedule him?    Per Dr. Eartha: we need to reschedule

## 2024-04-16 ENCOUNTER — Encounter (HOSPITAL_COMMUNITY): Admission: RE | Payer: Self-pay | Source: Home / Self Care

## 2024-04-16 ENCOUNTER — Ambulatory Visit (HOSPITAL_COMMUNITY): Admission: RE | Admit: 2024-04-16 | Source: Home / Self Care | Admitting: Gastroenterology

## 2024-04-16 SURGERY — COLONOSCOPY
Anesthesia: Choice

## 2024-04-18 ENCOUNTER — Other Ambulatory Visit (INDEPENDENT_AMBULATORY_CARE_PROVIDER_SITE_OTHER): Payer: Self-pay | Admitting: Gastroenterology

## 2024-04-18 DIAGNOSIS — K219 Gastro-esophageal reflux disease without esophagitis: Secondary | ICD-10-CM

## 2024-04-27 NOTE — Telephone Encounter (Signed)
 Pt is cancelling his procedure. He says he doesn't care to do the clean out because it wouldn't make him feel good. He says he will keep in touch about the acid reflux.  FYI

## 2024-04-27 NOTE — Telephone Encounter (Signed)
 Thanks for the update

## 2024-05-06 ENCOUNTER — Encounter (HOSPITAL_COMMUNITY)

## 2024-05-11 ENCOUNTER — Ambulatory Visit (HOSPITAL_COMMUNITY): Admit: 2024-05-11 | Admitting: Gastroenterology

## 2024-05-11 ENCOUNTER — Encounter (HOSPITAL_COMMUNITY): Payer: Self-pay

## 2024-05-11 SURGERY — COLONOSCOPY
Anesthesia: Choice

## 2024-06-30 ENCOUNTER — Encounter (INDEPENDENT_AMBULATORY_CARE_PROVIDER_SITE_OTHER): Payer: Self-pay | Admitting: Gastroenterology
